# Patient Record
Sex: Male | Born: 1935 | Race: White | Hispanic: No | Smoking: Never smoker
Health system: Southern US, Community
[De-identification: ages and names within clinical notes are randomized; demographics above are authoritative.]

## PROBLEM LIST (undated history)

## (undated) DIAGNOSIS — C801 Malignant (primary) neoplasm, unspecified: Secondary | ICD-10-CM

## (undated) DIAGNOSIS — G309 Alzheimer's disease, unspecified: Secondary | ICD-10-CM

## (undated) DIAGNOSIS — F0280 Dementia in other diseases classified elsewhere without behavioral disturbance: Secondary | ICD-10-CM

## (undated) DIAGNOSIS — N189 Chronic kidney disease, unspecified: Secondary | ICD-10-CM

## (undated) DIAGNOSIS — R296 Repeated falls: Secondary | ICD-10-CM

---

## 2012-03-16 ENCOUNTER — Encounter (HOSPITAL_COMMUNITY): Payer: Self-pay | Admitting: *Deleted

## 2012-03-16 ENCOUNTER — Emergency Department (HOSPITAL_COMMUNITY)
Admission: EM | Admit: 2012-03-16 | Discharge: 2012-03-17 | Disposition: A | Discharge status: Home / Self Care | Payer: Medicare Other | Attending: Emergency Medicine | Admitting: Emergency Medicine

## 2012-03-16 DIAGNOSIS — M7989 Other specified soft tissue disorders: Secondary | ICD-10-CM | POA: Insufficient documentation

## 2012-03-16 DIAGNOSIS — R443 Hallucinations, unspecified: Secondary | ICD-10-CM | POA: Insufficient documentation

## 2012-03-16 DIAGNOSIS — R4182 Altered mental status, unspecified: Secondary | ICD-10-CM

## 2012-03-16 DIAGNOSIS — I10 Essential (primary) hypertension: Secondary | ICD-10-CM | POA: Insufficient documentation

## 2012-03-16 DIAGNOSIS — Z79899 Other long term (current) drug therapy: Secondary | ICD-10-CM | POA: Insufficient documentation

## 2012-03-16 NOTE — ED Notes (Signed)
Family reports that pt has not been to dr in a long time, pt lives alone and has been hearing music playing in his apt that other people don't hear. Pt states that "something electronic is going on there and I've lived there for 22 years." Pt denies medical complaints. Pt A&Ox's 4. Family also c/o swelling and redness to feet. Reports not able to palpate pulses in feet. Pt also hypertensive in triage.

## 2012-03-17 ENCOUNTER — Emergency Department (HOSPITAL_COMMUNITY): Payer: Medicare Other

## 2012-03-17 LAB — RAPID URINE DRUG SCREEN, HOSP PERFORMED
Barbiturates: NOT DETECTED
Cocaine: NOT DETECTED
Tetrahydrocannabinol: NOT DETECTED

## 2012-03-17 LAB — COMPREHENSIVE METABOLIC PANEL
ALT: 14 U/L (ref 0–53)
Alkaline Phosphatase: 78 U/L (ref 39–117)
BUN: 22 mg/dL (ref 6–23)
CO2: 30 mEq/L (ref 19–32)
Chloride: 97 mEq/L (ref 96–112)
GFR calc Af Amer: >90 mL/min (ref >90)
GFR calc non Af Amer: 84 mL/min — ABNORMAL LOW (ref >90)
Glucose, Bld: 88 mg/dL (ref 70–99)
Potassium: 3.8 mEq/L (ref 3.5–5.1)
Sodium: 136 mEq/L (ref 135–145)
Total Bilirubin: 0.7 mg/dL (ref 0.3–1.2)

## 2012-03-17 LAB — URINALYSIS, ROUTINE W REFLEX MICROSCOPIC
Bilirubin Urine: NEGATIVE
Ketones, ur: 15 mg/dL — AB
Nitrite: NEGATIVE
Protein, ur: NEGATIVE mg/dL
Urobilinogen, UA: 0.2 mg/dL (ref 0.0–1.0)
pH: 6 (ref 5.0–8.0)

## 2012-03-17 LAB — CBC
HCT: 43.7 % (ref 39.0–52.0)
Hemoglobin: 14.6 g/dL (ref 13.0–17.0)
RBC: 4.67 MIL/uL (ref 4.22–5.81)

## 2012-03-17 MED ORDER — ZOLPIDEM TARTRATE 5 MG PO TABS: 5.0000 mg | ORAL_TABLET | Freq: Every evening | ORAL | Status: DC | PRN

## 2012-03-17 MED ORDER — LORAZEPAM 0.5 MG PO TABS: 0.5000 mg | ORAL_TABLET | Freq: Three times a day (TID) | ORAL | Status: DC | PRN

## 2012-03-17 MED ORDER — ONDANSETRON HCL 4 MG PO TABS: 4.0000 mg | ORAL_TABLET | Freq: Three times a day (TID) | ORAL | Status: DC | PRN

## 2012-03-17 MED ORDER — LORAZEPAM 1 MG PO TABS: 1.0000 mg | ORAL_TABLET | Freq: Three times a day (TID) | ORAL | Status: DC | PRN

## 2012-03-17 MED ORDER — IBUPROFEN 200 MG PO TABS: 600.0000 mg | ORAL_TABLET | Freq: Three times a day (TID) | ORAL | Status: DC | PRN

## 2012-03-17 MED ORDER — ACETAMINOPHEN 325 MG PO TABS: 650.0000 mg | ORAL_TABLET | ORAL | Status: DC | PRN

## 2012-03-17 NOTE — Discharge Instructions (Signed)
Confusion Confusion is the inability to think with your usual speed or clarity. Confusion may come on quickly or slowly over time. How quickly the confusion comes on depends on the cause. Confusion can be due to any number of causes. CAUSES   Concussion, head injury, or head trauma.   Seizures.   Stroke.   Fever.   Senility.   Heightened emotional states like rage or terror.   Mental illness in which the person loses the ability to determine what is real and what is not (hallucinations).   Infections.   Toxic effects from alcohol, drugs, or prescription medicines.   Dehydration and an imbalance of salts in the body (electrolytes).   Lack of sleep.   Low blood sugar (diabetes).   Low levels of oxygen (for example from chronic lung disorders).   Drug interactions or other medication side effects.   Nutritional deficiencies, especially niacin, thiamine, vitamin C, or vitamin B.   Sudden drop in body temperature (hypothermia).   Illness in the elderly. Constipation can result in confusion. An elderly person who is hospitalized may become confused due to change in daily routine.  SYMPTOMS  People often describe their thinking as cloudy or unclear when they are confused. Confusion can also include feeling disoriented. That means you are unaware of where or who you are. You may also not know what the date or time is. If confused, you may also have difficulty paying attention, remembering and making decisions. Some people also act aggressively when they are confused.  DIAGNOSIS  The medical evaluation of confusion may include:  Blood and urine tests.   X-rays.   Brain and nervous system tests.   Analyzing your brain waves (electroencphalogram or EEG).   A special X-ray (MRI) of your head or other special studies.  Your physician will ask questions such as:  Do you get days and nights mixed up?   Are you awake during regular sleep times?   Do you have trouble  recognizing people?   Do you know where you are?   Do you know the date and time?   Does the confusion come and go?   Is the confusion quickly getting worse?   Has there been a recent illness?   Has there been a recent head injury?   Are you diabetic?   Do you have a lung disorder?   What medication are you taking?   Have you taken drugs or alcohol?  TREATMENT  An admission to the hospital may not be needed, but a confused person should not be left alone. Stay with a family member or friend until the confusion clears. Avoid alcohol, pain relievers or sedative drugs until you have fully recovered. Do not drive until your caregiver says it is okay. HOME CARE INSTRUCTIONS What family and friends can do:  To find out if someone is confused ask him or her their name, age, and the date. If the person is unsure or answers incorrectly, he or she is confused.   Always introduce yourself, no matter how well the person knows you.   Often remind the person of his or her location.   Place a calendar and clock near the confused person.   Talk about current events and plans for the day.   Try to keep the environment calm, quiet and peaceful.   Make sure the patient keeps follow up appointments with their physician.  PREVENTION  Ways to prevent confusion:  Avoid alcohol.   Eat a balanced   diet.   Get enough sleep.   Do not become isolated. Spend time with other people and make plans for your days.   Keep careful watch on your blood sugar levels if you are diabetic.  SEEK IMMEDIATE MEDICAL CARE IF:   You develop severe headaches, repeated vomiting, seizures, blackouts or slurred speech.   There is increasing confusion, weakness, numbness, restlessness or personality changes.   You develop a loss of balance, have marked dizziness, feel uncoordinated or fall.   You have delusions, hallucinations or develop severe anxiety.   Your family members think you need to be rechecked.    Document Released: 10/17/2004 Document Revised: 08/29/2011 Document Reviewed: 06/14/2008 Exit[employer] Patient Information 2012 [employer]. 

## 2012-03-17 NOTE — ED Notes (Signed)
Attempted to call report to Psych ED. They request that pt waits until after shift change to go to Psych ED.

## 2012-03-17 NOTE — ED Provider Notes (Signed)
History     CSN: [id number]  Arrival date & time 03/16/12  1956   First MD Initiated Contact with Patient 03/16/12 2314      Chief Complaint  Patient presents with  . Hypertension  . Leg Swelling     The history is provided by the patient. History Limited By: Level V caveat: AMS.   the patient has not seen a physician in the very long time.  The patient lives alone in an apartment complex and recently the landlord called the family members because the patient has been shearing music in his apartment but nobody else can hear.  Family is concerned because this is abnormal behavior for him.  He has no underlying mental health issues that the family knows about but they state that he may have had some that was just undiagnosed.  His been no recent fever.  His eating his been poor as of lately.  No vomiting or diarrhea is noted by the family or patient.  The family reports is processes are not very clear in that at times he seems to speak of government related issues  History reviewed. No pertinent past medical history.  History reviewed. No pertinent past surgical history.  History reviewed. No pertinent family history.  History  Substance Use Topics  . Smoking status: Not on file  . Smokeless tobacco: Not on file  . Alcohol Use: No      Review of Systems  Unable to perform ROS   Allergies  Review of patient's allergies indicates no known allergies.  Home Medications   Current Outpatient Rx  Name Route Sig Dispense Refill  . ADULT MULTIVITAMIN W/MINERALS CH Oral Take 1 tablet by mouth daily.      BP 162/79  Pulse 60  Temp 98.4 F (36.9 C) (Oral)  Resp 18  Wt 138 lb 9.6 oz (62.869 kg)  SpO2 98%  Physical Exam  Nursing note and vitals reviewed. Constitutional: He is oriented to person, place, and time. He appears well-developed and well-nourished.  HENT:  Head: Normocephalic and atraumatic.  Eyes: EOM are normal.  Neck: Normal range of motion.  Cardiovascular:  Normal rate, regular rhythm, normal heart sounds and intact distal pulses.   Pulmonary/Chest: Effort normal and breath sounds normal. No respiratory distress.  Abdominal: Soft. He exhibits no distension. There is no tenderness.  Musculoskeletal: Normal range of motion.  Neurological: He is alert and oriented to person, place, and time.  Skin: Skin is warm and dry.  Psychiatric: He has a normal mood and affect.    ED Course  Procedures (including critical care time)  Labs Reviewed  COMPREHENSIVE METABOLIC PANEL - Abnormal; Notable for the following:    GFR calc non Af Amer 84 (*)     All other components within normal limits  URINALYSIS, ROUTINE W REFLEX MICROSCOPIC - Abnormal; Notable for the following:    Ketones, ur 15 (*)     All other components within normal limits  CBC  ETHANOL  URINE RAPID DRUG SCREEN (HOSP PERFORMED)   Ct Head Wo Contrast  03/17/2012  *RADIOLOGY REPORT*  Clinical Data: 78 year old male with hypertension, lower extremity swelling, hallucinations.  CT HEAD WITHOUT CONTRAST  Technique:  Contiguous axial images were obtained from the base of the skull through the vertex without contrast.  Comparison: None.  Findings: Visualized paranasal sinuses and mastoids are clear. Right TMJ degenerative changes. No acute osseous abnormality identified.  Visualized orbits and scalp soft tissues are within normal limits.  No  ventriculomegaly.  Cerebral volume within normal limits for age. No midline shift, mass effect, or evidence of mass lesion.  No acute intracranial hemorrhage identified.  No evidence of cortically based acute infarction identified.  No suspicious intracranial vascular hyperdensity.  IMPRESSION: Normal noncontrast CT appearance of the brain for age.  Original Report Authenticated By: Harley Hallmark, M.D.    I personally reviewed the imaging tests through PACS system    No diagnosis found.    MDM  Patient is developing new auditory hallucinations.  His  labs urine and CT scan of his had a normal.  He was hypertensive on arrival but without treatment his hypertension is improved.  He does not followup with primary care Dr.  His family is concerned and I would agree that there may be some mental health and stability and this may be severe depression with psychotic features. ACT to evaluate. Pt may benefit from [facility], MD 03/17/12 [phone]

## 2012-03-17 NOTE — ED Notes (Signed)
Patient returned from X-ray 

## 2012-03-17 NOTE — ED Notes (Signed)
Pt's family left. Pt up and out of bed walking around room. Charge Nurse, Tammy notified that sitter is needed. Lenny, NT will sit with pt.

## 2012-03-17 NOTE — ED Notes (Signed)
Phone numbers of pt's family members. Kevan Ny (daughter) [phone], [phone]. 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 Zeb Comfort (daughter) [phone], work [phone]. Venetia Night (sister) [phone], [phone].

## 2012-03-17 NOTE — ED Provider Notes (Signed)
Pt assessed by act.  Pt hears music but is not having any command auditory hallucinations.  Does not meet criteria for inpatient psychiatric treatment.  [facility]  Family and patient are comfortable with plan.  At this time there does not appear to be any evidence of an acute emergency medical condition and the patient appears stable for discharge with appropriate outpatient follow up.   Celene Kras, MD 03/17/12 1038

## 2012-03-17 NOTE — ED Notes (Signed)
Pt's daughter states that pt lives alone. She states he has had a pneumonia vaccine at age 78.

## 2012-03-17 NOTE — BH Assessment (Signed)
Assessment Note   Evan Maynard is an 78 y.o. male. Patient presents to [facility] ED after family received report from patient's landlord stating patient has been having hallucinations. Hallucinations involve hearing music from other apartments.  Out of concern, patient's family bought patient to the ED to be evaluated.  Patient advised he likes the music and it's pleasant. He denies VH and AH does not provide a command. Patient denied SI/HI.   Patient was cleared medically and does not meet inpatient criteria for psych. CSW provided patient and family with information to [place] and instructions on how to enroll patient for benefits. Patient's sister did not express any concern regarding disposition.  Discussed with EDP who agrees.  Axis I: Psychotic Disorder NOS Axis II: Deferred Axis III: History reviewed. No pertinent past medical history. Axis IV: other psychosocial or environmental problems and problems with access to health care services Axis V: 51-60 moderate symptoms  Past Medical History: History reviewed. No pertinent past medical history.  History reviewed. No pertinent past surgical history.  Family History: History reviewed. No pertinent family history.  Social History:  does not have a smoking history on file. He does not have any smokeless tobacco history on file. He reports that he does not drink alcohol or use illicit drugs.  Additional Social History:     CIWA: CIWA-Ar BP: 162/79 mmHg Pulse Rate: 60  COWS:    Allergies: No Known Allergies  Home Medications:  (Not in a hospital admission)  OB/GYN Status:  No LMP for male patient.  General Assessment Data Location of Assessment: [facility] ED Living Arrangements: Alone Can pt return to current living arrangement?: Yes Admission Status: Voluntary Is patient capable of signing voluntary admission?: Yes Transfer from: Acute Hospital Referral Source: Self/Family/Friend     Risk to self Suicidal Ideation: No Suicidal Intent:  No Is patient at risk for suicide?: No Suicidal Plan?: No Access to Means: No What has been your use of drugs/alcohol within the last 12 months?: NA Previous Attempts/Gestures: No Intentional Self Injurious Behavior: None Family Suicide History: No Persecutory voices/beliefs?: No Depression: No Substance abuse history and/or treatment for substance abuse?: No Suicide prevention information given to non-admitted patients: Not applicable  Risk to Others Homicidal Ideation: No Thoughts of Harm to Others: No Current Homicidal Intent: No Current Homicidal Plan: No Access to Homicidal Means: No History of harm to others?: No Assessment of Violence: None Noted Does patient have access to weapons?: No Criminal Charges Pending?: No Does patient have a court date: No  Psychosis Hallucinations: Auditory (Hears music and whistles) Delusions: None noted  Mental Status Report Appear/Hygiene: Improved Eye Contact: Good Motor Activity: Freedom of movement Speech: Soft;Logical/coherent Level of Consciousness: Alert Affect: Appropriate to circumstance Anxiety Level: None Thought Processes: Flight of Ideas;Relevant;Coherent Judgement: Unimpaired Orientation: Person;Place;Time;Situation Obsessive Compulsive Thoughts/Behaviors: None  Cognitive Functioning Concentration: Decreased Memory: Recent Intact;Remote Intact IQ: Average Insight: Fair Impulse Control: Good Appetite: Fair Weight Loss: 10  Sleep: No Change Vegetative Symptoms: None  ADLScreening [facility] Assessment Services) Patient's cognitive ability adequate to safely complete daily activities?: Yes Patient able to express need for assistance with ADLs?: Yes Independently performs ADLs?: Yes  Abuse/Neglect [facility]) Physical Abuse: Denies Verbal Abuse: Denies Sexual Abuse: Denies  Prior Inpatient Therapy Prior Inpatient Therapy: No  Prior Outpatient Therapy Prior Outpatient Therapy: No  ADL Screening (condition at time  of admission) Patient's cognitive ability adequate to safely complete daily activities?: Yes Patient able to express need for assistance with ADLs?: Yes Independently performs ADLs?:  Yes       Abuse/Neglect Assessment (Assessment to be complete while patient is alone) Physical Abuse: Denies Verbal Abuse: Denies Sexual Abuse: Denies Exploitation of patient/patient's resources: Denies Self-Neglect: Denies Values / Beliefs Cultural Requests During Hospitalization: None Spiritual Requests During Hospitalization: None        Additional Information 1:1 In Past 12 Months?: No CIRT Risk: No Elopement Risk: No Does patient have medical clearance?: Yes     Disposition:  Disposition Disposition of Patient: Outpatient treatment Type of outpatient treatment: Adult (Follow up with [facility])  On Site Evaluation by:   Reviewed with Physician:     Marlaine Hind ANN S 03/17/2012 10:42 AM

## 2014-05-17 ENCOUNTER — Other Ambulatory Visit: Payer: Self-pay | Admitting: Internal Medicine

## 2014-05-17 DIAGNOSIS — R410 Disorientation, unspecified: Secondary | ICD-10-CM

## 2014-05-20 ENCOUNTER — Other Ambulatory Visit: Payer: TRICARE For Life (TFL)

## 2014-05-23 ENCOUNTER — Ambulatory Visit
Admission: RE | Admit: 2014-05-23 | Discharge: 2014-05-23 | Disposition: A | Discharge status: Home / Self Care | Payer: TRICARE For Life (TFL) | Source: Ambulatory Visit | Attending: Internal Medicine | Admitting: Internal Medicine

## 2014-05-23 ENCOUNTER — Other Ambulatory Visit: Payer: TRICARE For Life (TFL)

## 2014-05-23 DIAGNOSIS — R410 Disorientation, unspecified: Secondary | ICD-10-CM

## 2014-08-02 ENCOUNTER — Encounter (HOSPITAL_COMMUNITY): Payer: Self-pay | Admitting: Emergency Medicine

## 2014-08-02 ENCOUNTER — Emergency Department (HOSPITAL_COMMUNITY): Payer: Medicare Other

## 2014-08-02 ENCOUNTER — Emergency Department (HOSPITAL_COMMUNITY)
Admission: EM | Admit: 2014-08-02 | Discharge: 2014-08-02 | Disposition: A | Discharge status: Home / Self Care | Payer: Medicare Other | Attending: Emergency Medicine | Admitting: Emergency Medicine

## 2014-08-02 DIAGNOSIS — S61217A Laceration without foreign body of left little finger without damage to nail, initial encounter: Secondary | ICD-10-CM | POA: Insufficient documentation

## 2014-08-02 DIAGNOSIS — Z9181 History of falling: Secondary | ICD-10-CM | POA: Insufficient documentation

## 2014-08-02 DIAGNOSIS — Z7982 Long term (current) use of aspirin: Secondary | ICD-10-CM | POA: Insufficient documentation

## 2014-08-02 DIAGNOSIS — S61215A Laceration without foreign body of left ring finger without damage to nail, initial encounter: Secondary | ICD-10-CM | POA: Diagnosis not present

## 2014-08-02 DIAGNOSIS — S6992XA Unspecified injury of left wrist, hand and finger(s), initial encounter: Secondary | ICD-10-CM | POA: Diagnosis present

## 2014-08-02 DIAGNOSIS — W108XXA Fall (on) (from) other stairs and steps, initial encounter: Secondary | ICD-10-CM | POA: Diagnosis not present

## 2014-08-02 DIAGNOSIS — Y9301 Activity, walking, marching and hiking: Secondary | ICD-10-CM | POA: Diagnosis not present

## 2014-08-02 DIAGNOSIS — Y9289 Other specified places as the place of occurrence of the external cause: Secondary | ICD-10-CM | POA: Insufficient documentation

## 2014-08-02 DIAGNOSIS — IMO0002 Reserved for concepts with insufficient information to code with codable children: Secondary | ICD-10-CM

## 2014-08-02 DIAGNOSIS — Z23 Encounter for immunization: Secondary | ICD-10-CM | POA: Diagnosis not present

## 2014-08-02 DIAGNOSIS — Y998 Other external cause status: Secondary | ICD-10-CM | POA: Insufficient documentation

## 2014-08-02 DIAGNOSIS — S0081XA Abrasion of other part of head, initial encounter: Secondary | ICD-10-CM | POA: Diagnosis not present

## 2014-08-02 DIAGNOSIS — Z79899 Other long term (current) drug therapy: Secondary | ICD-10-CM | POA: Insufficient documentation

## 2014-08-02 DIAGNOSIS — W19XXXA Unspecified fall, initial encounter: Secondary | ICD-10-CM

## 2014-08-02 MED ORDER — TETANUS-DIPHTH-ACELL PERTUSSIS 5-2.5-18.5 LF-MCG/0.5 IM SUSP
0.5000 mL | Freq: Once | INTRAMUSCULAR | Status: AC
  Administered 2014-08-02: 0.5 mL via INTRAMUSCULAR
  Filled 2014-08-02: qty 0.5

## 2014-08-02 MED ORDER — CEPHALEXIN 500 MG PO CAPS: 500.0000 mg | ORAL_CAPSULE | Freq: Four times a day (QID) | ORAL | Status: DC

## 2014-08-02 MED ORDER — LIDOCAINE HCL 1 % IJ SOLN
5.0000 mL | Freq: Once | INTRAMUSCULAR | Status: AC
  Administered 2014-08-02: 5 mL

## 2014-08-02 NOTE — ED Notes (Signed)
Evan Maynard daughter [phone] call for discharge.

## 2014-08-02 NOTE — ED Notes (Signed)
Pt ambulates in hallway with no assist. No pain.

## 2014-08-02 NOTE — ED Notes (Signed)
Patient transported to X-ray 

## 2014-08-02 NOTE — ED Notes (Signed)
Suture cart at the bedside.  

## 2014-08-02 NOTE — ED Notes (Signed)
Pt from [place] with c/o of fall and left hand pain. No LOC. Abrasions of head

## 2014-08-02 NOTE — ED Provider Notes (Signed)
CSN: [id number]     Arrival date & time 08/02/14  1422 History   First MD Initiated Contact with Patient 08/02/14 1615     Chief Complaint  Patient presents with  . Fall  . Hand Injury     (Consider location/radiation/quality/duration/timing/severity/associated sxs/prior Treatment) HPI Comments: Patient is an 80 year old male who presents the emergency department after a fall. He reports that he was walking up stairs when he lost his balance and fell backwards. He denies any other symptoms. He denies dizziness, lightheadedness, chest pain, shortness of breath. He reports that he has frequent falls due to losing his balance. He denies any loss of consciousness. He denies hitting his head, but there appears to be an abrasion to his forehead. He is unsure of his last tetanus shot.   Patient is a 80 y.o. male presenting with fall and hand injury. The history is provided by the patient. No language interpreter was used.  Fall Pertinent negatives include no abdominal pain, chest pain, chills, fever, headaches, nausea or vomiting.  Hand Injury Associated symptoms: no fever     History reviewed. No pertinent past medical history. History reviewed. No pertinent past surgical history. No family history on file. History  Substance Use Topics  . Smoking status: Not on file  . Smokeless tobacco: Not on file  . Alcohol Use: No    Review of Systems  Constitutional: Negative for fever and chills.  Respiratory: Negative for shortness of breath.   Cardiovascular: Negative for chest pain.  Gastrointestinal: Negative for nausea, vomiting and abdominal pain.  Skin: Positive for wound.  Neurological: Negative for dizziness, light-headedness and headaches.  All other systems reviewed and are negative.     Allergies  Review of patient's allergies indicates no known allergies.  Home Medications   Prior to Admission medications   Medication Sig Start Date End Date Taking? Authorizing Provider    aspirin EC 81 MG tablet Take 81 mg by mouth once a week.   Yes Historical Provider, MD  furosemide (LASIX) 20 MG tablet Take 20 mg by mouth every other day.  07/04/14  Yes Historical Provider, MD  ramipril (ALTACE) 5 MG capsule Take 5 mg by mouth every morning.  07/02/14  Yes Historical Provider, MD  Multiple Vitamin (MULTIVITAMIN WITH MINERALS) TABS Take 1 tablet by mouth daily.    Historical Provider, MD   BP 133/65 mmHg  Pulse 74  Temp(Src) 98.2 F (36.8 C) (Oral)  Resp 14  SpO2 98% Physical Exam  Constitutional: He is oriented to person, place, and time. He appears well-developed and well-nourished. No distress.  HENT:  Head: Normocephalic and atraumatic.    Right Ear: External ear normal.  Left Ear: External ear normal.  Nose: Nose normal.  No broken or loose teeth  Eyes: Conjunctivae and EOM are normal. Pupils are equal, round, and reactive to light.  Neck: Normal range of motion. No spinous process tenderness and no muscular tenderness present. No tracheal deviation present.  Cardiovascular: Normal rate, regular rhythm, normal heart sounds, intact distal pulses and normal pulses.   Pulses:      Radial pulses are 2+ on the right side, and 2+ on the left side.       Posterior tibial pulses are 2+ on the right side, and 2+ on the left side.  Capillary refill < 3 seconds in all fingers  Pulmonary/Chest: Effort normal and breath sounds normal. No stridor.  Abdominal: Soft. He exhibits no distension. There is no tenderness.  Musculoskeletal: Normal  range of motion.  No tenderness to palpation over scaphoid bilaterally Grip strength 5/5 bilaterally  Neurological: He is alert and oriented to person, place, and time. He has normal strength. No sensory deficit. GCS eye subscore is 4. GCS verbal subscore is 5. GCS motor subscore is 6.  Sensation intact to all fingertips Normal gait  Skin: Skin is warm and dry. He is not diaphoretic.  2 cm laceration to distal 5th finger 1.5 cm V  shaped laceration to distal 4th finger No foreign bodies visualized  Psychiatric: He has a normal mood and affect. His behavior is normal.  Nursing note and vitals reviewed.   ED Course  Procedures (including critical care time) Labs Review Labs Reviewed - No data to display  Imaging Review No results found.   EKG Interpretation None      LACERATION REPAIR Performed by: Cleatrice Burke Authorized by: Cleatrice Burke Consent: Verbal consent obtained. Risks and benefits: risks, benefits and alternatives were discussed Consent given by: patient Patient identity confirmed: provided demographic data Prepped and Draped in normal sterile fashion Wound explored  Laceration Location: 5th finger  Laceration Length: 2cm  No Foreign Bodies seen or palpated  Anesthesia: digital block  Local anesthetic: lidocaine 1%  Anesthetic total: 3 ml  Irrigation method: syringe Amount of cleaning: standard  Skin closure: 4-0 Ethilon  Number of sutures: 3  Technique: simple interrupted   Patient tolerance: Patient tolerated the procedure well with no immediate complications. LACERATION REPAIR Performed by: Cleatrice Burke Authorized by: Cleatrice Burke Consent: Verbal consent obtained. Risks and benefits: risks, benefits and alternatives were discussed Consent given by: patient Patient identity confirmed: provided demographic data Prepped and Draped in normal sterile fashion Wound explored  Laceration Location: 4th finger  Laceration Length: 1.5 cm  No Foreign Bodies seen or palpated  Irrigation method: syringe Amount of cleaning: standard  Skin closure: 4-0 Ethilon  Number of sutures: 1  Technique: simple interrupted   Patient tolerance: Patient tolerated the procedure well with no immediate complications.   MDM   Final diagnoses:  Fall  Laceration   Patient with mechanical fall. CT head and cervical spine show no acute abnormality. XR of hand shows no acute  pathology. Tdap booster given. Wound cleaning complete with pressure irrigation, bottom of wound visualized, no foreign bodies appreciated. Laceration occurred < 8 hours prior to repair which was well tolerated.  Discussed suture home care w pt and answered questions. Pt to f-u for wound check and suture removal in 7 days. Will give keflex for prophylaxis. Pt is hemodynamically stable w no complaints prior to dc. Dr. Ralene Bathe evaluated patient and agrees with plan. Patient / Family / Caregiver informed of clinical course, understand medical decision-making process, and agree with plan.       Elwyn Lade, PA-C 08/04/14 0109  Quintella Reichert, MD 08/06/14 [phone]

## 2014-08-02 NOTE — Discharge Instructions (Signed)
Fall Prevention and Home Safety Falls cause injuries and can affect all age groups. It is possible to prevent falls.  HOW TO PREVENT FALLS  Wear shoes with rubber soles that do not have an opening for your toes.  Keep the inside and outside of your house well lit.  Use night lights throughout your home.  Remove clutter from floors.  Clean up floor spills.  Remove throw rugs or fasten them to the floor with carpet tape.  Do not place electrical cords across pathways.  Put grab bars by your tub, shower, and toilet. Do not use towel bars as grab bars.  Put handrails on both sides of the stairway. Fix loose handrails.  Do not climb on stools or stepladders, if possible.  Do not wax your floors.  Repair uneven or unsafe sidewalks, walkways, or stairs.  Keep items you use a lot within reach.  Be aware of pets.  Keep emergency numbers next to the telephone.  Put smoke detectors in your home and near bedrooms. Ask your doctor what other things you can do to prevent falls. Document Released: 07/06/2009 Document Revised: 03/10/2012 Document Reviewed: 12/10/2011 [facility] Patient Information 2015 [place], [place]. This information is not intended to replace advice given to you by your health care provider. Make sure you discuss any questions you have with your health care provider.  Laceration Care, Adult A laceration is a cut that goes through all layers of the skin. The cut goes into the tissue beneath the skin. HOME CARE For stitches (sutures) or staples:  Keep the cut clean and dry.  If you have a bandage (dressing), change it at least once a day. Change the bandage if it gets wet or dirty, or as told by your doctor.  Wash the cut with soap and water 2 times a day. Rinse the cut with water. Pat it dry with a clean towel.  Put a thin layer of medicated cream on the cut as told by your doctor.  You may shower after the first 24 hours. Do not soak the cut in water until the  stitches are removed.  Only take medicines as told by your doctor.  Have your stitches or staples removed as told by your doctor. For skin adhesive strips:  Keep the cut clean and dry.  Do not get the strips wet. You may take a bath, but be careful to keep the cut dry.  If the cut gets wet, pat it dry with a clean towel.  The strips will fall off on their own. Do not remove the strips that are still stuck to the cut. For wound glue:  You may shower or take baths. Do not soak or scrub the cut. Do not swim. Avoid heavy sweating until the glue falls off on its own. After a shower or bath, pat the cut dry with a clean towel.  Do not put medicine on your cut until the glue falls off.  If you have a bandage, do not put tape over the glue.  Avoid lots of sunlight or tanning lamps until the glue falls off. Put sunscreen on the cut for the first year to reduce your scar.  The glue will fall off on its own. Do not pick at the glue. You may need a tetanus shot if:  You cannot remember when you had your last tetanus shot.  You have never had a tetanus shot. If you need a tetanus shot and you choose not to have one, you  may get tetanus. Sickness from tetanus can be serious. GET HELP RIGHT AWAY IF:   Your pain does not get better with medicine.  Your arm, hand, leg, or foot loses feeling (numbness) or changes color.  Your cut is bleeding.  Your joint feels weak, or you cannot use your joint.  You have painful lumps on your body.  Your cut is red, puffy (swollen), or painful.  You have a red line on the skin near the cut.  You have yellowish-white fluid (pus) coming from the cut.  You have a fever.  You have a bad smell coming from the cut or bandage.  Your cut breaks open before or after stitches are removed.  You notice something coming out of the cut, such as wood or glass.  You cannot move a finger or toe. MAKE SURE YOU:   Understand these instructions.  Will watch  your condition.  Will get help right away if you are not doing well or get worse. Document Released: 02/26/2008 Document Revised: 12/02/2011 Document Reviewed: 03/05/2011 [facility] Patient Information 2015 [place], [place]. This information is not intended to replace advice given to you by your health care provider. Make sure you discuss any questions you have with your health care provider.

## 2014-08-02 NOTE — ED Notes (Signed)
Bed: [facility] Expected date:  Expected time:  Means of arrival:  Comments: Ems- fall, left hand fingers lacerations

## 2014-08-02 NOTE — ED Notes (Signed)
Evan Maynard/daughter [phone]

## 2014-12-28 ENCOUNTER — Encounter (HOSPITAL_COMMUNITY): Payer: Self-pay | Admitting: Emergency Medicine

## 2014-12-28 ENCOUNTER — Emergency Department (HOSPITAL_COMMUNITY)
Admission: EM | Admit: 2014-12-28 | Discharge: 2014-12-28 | Disposition: A | Discharge status: Home / Self Care | Payer: Medicare Other | Attending: Emergency Medicine | Admitting: Emergency Medicine

## 2014-12-28 ENCOUNTER — Emergency Department (HOSPITAL_COMMUNITY): Payer: Medicare Other

## 2014-12-28 DIAGNOSIS — Y92193 Bedroom in other specified residential institution as the place of occurrence of the external cause: Secondary | ICD-10-CM | POA: Diagnosis not present

## 2014-12-28 DIAGNOSIS — Y998 Other external cause status: Secondary | ICD-10-CM | POA: Diagnosis not present

## 2014-12-28 DIAGNOSIS — Z8546 Personal history of malignant neoplasm of prostate: Secondary | ICD-10-CM | POA: Diagnosis not present

## 2014-12-28 DIAGNOSIS — Y9389 Activity, other specified: Secondary | ICD-10-CM | POA: Insufficient documentation

## 2014-12-28 DIAGNOSIS — I129 Hypertensive chronic kidney disease with stage 1 through stage 4 chronic kidney disease, or unspecified chronic kidney disease: Secondary | ICD-10-CM | POA: Insufficient documentation

## 2014-12-28 DIAGNOSIS — I2510 Atherosclerotic heart disease of native coronary artery without angina pectoris: Secondary | ICD-10-CM | POA: Insufficient documentation

## 2014-12-28 DIAGNOSIS — S51002A Unspecified open wound of left elbow, initial encounter: Secondary | ICD-10-CM | POA: Insufficient documentation

## 2014-12-28 DIAGNOSIS — N189 Chronic kidney disease, unspecified: Secondary | ICD-10-CM | POA: Diagnosis not present

## 2014-12-28 DIAGNOSIS — I6789 Other cerebrovascular disease: Secondary | ICD-10-CM | POA: Diagnosis not present

## 2014-12-28 DIAGNOSIS — Z85828 Personal history of other malignant neoplasm of skin: Secondary | ICD-10-CM | POA: Diagnosis not present

## 2014-12-28 DIAGNOSIS — W19XXXA Unspecified fall, initial encounter: Secondary | ICD-10-CM

## 2014-12-28 DIAGNOSIS — Z792 Long term (current) use of antibiotics: Secondary | ICD-10-CM | POA: Insufficient documentation

## 2014-12-28 DIAGNOSIS — W06XXXA Fall from bed, initial encounter: Secondary | ICD-10-CM | POA: Diagnosis not present

## 2014-12-28 DIAGNOSIS — Z79899 Other long term (current) drug therapy: Secondary | ICD-10-CM | POA: Insufficient documentation

## 2014-12-28 DIAGNOSIS — S59902A Unspecified injury of left elbow, initial encounter: Secondary | ICD-10-CM | POA: Diagnosis present

## 2014-12-28 DIAGNOSIS — Z7982 Long term (current) use of aspirin: Secondary | ICD-10-CM | POA: Diagnosis not present

## 2014-12-28 HISTORY — DX: Chronic kidney disease, unspecified: N18.9

## 2014-12-28 HISTORY — DX: Malignant (primary) neoplasm, unspecified: C80.1

## 2014-12-28 LAB — URINALYSIS, ROUTINE W REFLEX MICROSCOPIC
BILIRUBIN URINE: NEGATIVE
Glucose, UA: NEGATIVE mg/dL
Hgb urine dipstick: NEGATIVE
Ketones, ur: NEGATIVE mg/dL
Leukocytes, UA: NEGATIVE
Nitrite: NEGATIVE
PROTEIN: NEGATIVE mg/dL
Specific Gravity, Urine: 1.018 (ref 1.005–1.030)
UROBILINOGEN UA: 1 mg/dL (ref 0.0–1.0)
pH: 7 (ref 5.0–8.0)

## 2014-12-28 LAB — I-STAT CHEM 8, ED
BUN: 26 mg/dL — ABNORMAL HIGH (ref 6–23)
Calcium, Ion: 1.16 mmol/L (ref 1.13–1.30)
Chloride: 100 mmol/L (ref 96–112)
Creatinine, Ser: 1.2 mg/dL (ref 0.50–1.35)
Glucose, Bld: 94 mg/dL (ref 70–99)
HCT: 39 % (ref 39.0–52.0)
Hemoglobin: 13.3 g/dL (ref 13.0–17.0)
Potassium: 4.2 mmol/L (ref 3.5–5.1)
Sodium: 139 mmol/L (ref 135–145)
TCO2: 24 mmol/L (ref 0–100)

## 2014-12-28 NOTE — ED Notes (Signed)
Bed: WA15 Expected date:  Expected time:  Means of arrival:  Comments: EMS-fall 

## 2014-12-28 NOTE — ED Provider Notes (Signed)
  Face-to-face evaluation   History: Patient reportedly fell out of bed, a unwitnessed fall. He injured his left arm, as an abrasion.  Physical exam: Alert, calm, cooperative. Good grip strength both hands. Able to move both arms. Moderate rigidity in both legs, but it is symmetric. No deformity of the legs.  Medical screening examination/treatment/procedure(s) were conducted as a shared visit with non-physician practitioner(s) and myself.  I personally evaluated the patient during the encounter  Daleen Bo, MD 12/28/14 [phone]

## 2014-12-28 NOTE — ED Notes (Addendum)
Per EMS pt from [facility] with complaint of unwitnessed fall resulting in left upper arm and left elbow skin tear. Staff report pt is baseline. Pt denies pain.

## 2014-12-28 NOTE — Discharge Instructions (Signed)

## 2014-12-28 NOTE — ED Provider Notes (Signed)
CSN: [id number]     Arrival date & time 12/28/14  1638 History   First MD Initiated Contact with Patient 12/28/14 [phone]     Chief Complaint  Patient presents with  . Fall     (Consider location/radiation/quality/duration/timing/severity/associated sxs/prior Treatment) HPI Comments: Patient with past medical history of CAD, hypertension, skin cancer presents emergency department with chief complaint of fall. He stays at [facility] assisted living.  He reportedly had an unwitnessed fall. Patient states that he was sitting on the edge of his bed and reached for something on his bedside table, losing his balance and falling to the ground. States that he landed on his left elbow. He denies hitting his head. Denies any head pain, neck pain, chest pain, abdominal pain, or pain in his extremities. Denies any recent fevers, chills, shortness of breath, cough, nausea, or vomiting. States that he is otherwise very healthy. He is accompanied by family members, who states that he is acting normally.  The history is provided by the patient. No language interpreter was used.    Past Medical History  Diagnosis Date  . Chronic kidney disease   . Cancer     prostate   History reviewed. No pertinent past surgical history. No family history on file. History  Substance Use Topics  . Smoking status: Not on file  . Smokeless tobacco: Not on file  . Alcohol Use: No    Review of Systems  Constitutional: Negative for fever and chills.  Respiratory: Negative for shortness of breath.   Cardiovascular: Negative for chest pain.  Gastrointestinal: Negative for nausea, vomiting, diarrhea and constipation.  Genitourinary: Negative for dysuria.  Skin: Positive for wound.  All other systems reviewed and are negative.     Allergies  Review of patient's allergies indicates no known allergies.  Home Medications   Prior to Admission medications   Medication Sig Start Date End Date Taking? Authorizing Provider   aspirin 81 MG chewable tablet Chew 81 mg by mouth once a week.   Yes Historical Provider, MD  furosemide (LASIX) 20 MG tablet Take 20 mg by mouth daily.  07/04/14  Yes Historical Provider, MD  cephALEXin (KEFLEX) 500 MG capsule Take 1 capsule (500 mg total) by mouth 4 (four) times daily. 08/02/14   Cleatrice Burke, PA-C   BP 160/70 mmHg  Pulse 75  Temp(Src) 98.6 F (37 C) (Oral)  Resp 20  SpO2 100% Physical Exam  Constitutional: He is oriented to person, place, and time. He appears well-developed and well-nourished.  In c-collar  HENT:  Head: Normocephalic and atraumatic.  Eyes: Conjunctivae and EOM are normal. Pupils are equal, round, and reactive to light. Right eye exhibits no discharge. Left eye exhibits no discharge. No scleral icterus.  Neck: Normal range of motion. Neck supple. No JVD present.  Cardiovascular: Normal rate, regular rhythm and normal heart sounds.  Exam reveals no gallop and no friction rub.   No murmur heard. Pulmonary/Chest: Effort normal and breath sounds normal. No respiratory distress. He has no wheezes. He has no rales. He exhibits no tenderness.  Abdominal: Soft. He exhibits no distension and no mass. There is no tenderness. There is no rebound and no guarding.  Musculoskeletal: Normal range of motion. He exhibits no edema or tenderness.  Moves all extremities, no tenderness to palpation of the extremities  Neurological: He is alert and oriented to person, place, and time.  CN III-12 intact, no pronator drift, speech is clear, movements are goal oriented  Skin: Skin is warm  and dry.  Skin tear to left elbow, no laceration  Psychiatric: He has a normal mood and affect. His behavior is normal. Judgment and thought content normal.  Nursing note and vitals reviewed.   ED Course  Procedures (including critical care time) Results for orders placed or performed during the hospital encounter of 12/28/14  Urinalysis, Routine w reflex microscopic  Result Value  Ref Range   Color, Urine YELLOW YELLOW   APPearance CLEAR CLEAR   Specific Gravity, Urine 1.018 1.005 - 1.030   pH 7.0 5.0 - 8.0   Glucose, UA NEGATIVE NEGATIVE mg/dL   Hgb urine dipstick NEGATIVE NEGATIVE   Bilirubin Urine NEGATIVE NEGATIVE   Ketones, ur NEGATIVE NEGATIVE mg/dL   Protein, ur NEGATIVE NEGATIVE mg/dL   Urobilinogen, UA 1.0 0.0 - 1.0 mg/dL   Nitrite NEGATIVE NEGATIVE   Leukocytes, UA NEGATIVE NEGATIVE  I-stat chem 8, ed  Result Value Ref Range   Sodium 139 135 - 145 mmol/L   Potassium 4.2 3.5 - 5.1 mmol/L   Chloride 100 96 - 112 mmol/L   BUN 26 (H) 6 - 23 mg/dL   Creatinine, Ser 1.20 0.50 - 1.35 mg/dL   Glucose, Bld 94 70 - 99 mg/dL   Calcium, Ion 1.16 1.13 - 1.30 mmol/L   TCO2 24 0 - 100 mmol/L   Hemoglobin 13.3 13.0 - 17.0 g/dL   HCT 39.0 39.0 - 52.0 %   Ct Head Wo Contrast  12/28/2014   CLINICAL DATA:  Unwitnessed fall. Left upper arm and left elbow skin tears. Denies pain.  EXAM: CT HEAD WITHOUT CONTRAST  CT CERVICAL SPINE WITHOUT CONTRAST  TECHNIQUE: Multidetector CT imaging of the head and cervical spine was performed following the standard protocol without intravenous contrast. Multiplanar CT image reconstructions of the cervical spine were also generated.  COMPARISON:  None.  FINDINGS: CT HEAD FINDINGS  There is no evidence of mass effect, midline shift, or extra-axial fluid collections. There is no evidence of a space-occupying lesion or intracranial hemorrhage. There is no evidence of a cortical-based area of acute infarction. There is generalized cerebral atrophy. There is periventricular white matter low attenuation likely secondary to microangiopathy.  The ventricles and sulci are appropriate for the patient's age. The basal cisterns are patent.  Visualized portions of the orbits are unremarkable. The visualized portions of the paranasal sinuses and mastoid air cells are unremarkable. Cerebrovascular atherosclerotic calcifications are noted.  The osseous  structures are unremarkable.  CT CERVICAL SPINE FINDINGS  The alignment is anatomic. The vertebral body heights are maintained. There is no acute fracture. There is no static listhesis. The prevertebral soft tissues are normal. The intraspinal soft tissues are not fully imaged on this examination due to poor soft tissue contrast, but there is no gross soft tissue abnormality.  Degenerative disc disease of the cervical spine most severe at C5-6 and C6-7 with disc height loss. Bilateral facet arthropathy at C2-3 with right foraminal encroachment. Bilateral facet arthropathy at C3-4 with bilateral uncovertebral degenerative changes with foraminal encroachment, left greater than right. Bilateral facet arthropathy at C4-5. Moderate left facet arthropathy and bilateral uncovertebral degenerative changes at C5-6 with bilateral foraminal encroachment. Bilateral uncovertebral degenerative changes at C6-7 with foraminal encroachment.  The visualized portions of the lung apices demonstrate no focal abnormality.There is bilateral carotid artery atherosclerosis.  IMPRESSION: 1.  No acute intracranial pathology. 2. Chronic microvascular disease and cerebral atrophy. 3. No acute osseous injury of the cervical spine. 4. Cervical spine spondylosis.   Electronically Signed  By: Kathreen Devoid   On: 12/28/2014 11:25   Ct Cervical Spine Wo Contrast  12/28/2014   CLINICAL DATA:  Unwitnessed fall. Left upper arm and left elbow skin tears. Denies pain.  EXAM: CT HEAD WITHOUT CONTRAST  CT CERVICAL SPINE WITHOUT CONTRAST  TECHNIQUE: Multidetector CT imaging of the head and cervical spine was performed following the standard protocol without intravenous contrast. Multiplanar CT image reconstructions of the cervical spine were also generated.  COMPARISON:  None.  FINDINGS: CT HEAD FINDINGS  There is no evidence of mass effect, midline shift, or extra-axial fluid collections. There is no evidence of a space-occupying lesion or intracranial  hemorrhage. There is no evidence of a cortical-based area of acute infarction. There is generalized cerebral atrophy. There is periventricular white matter low attenuation likely secondary to microangiopathy.  The ventricles and sulci are appropriate for the patient's age. The basal cisterns are patent.  Visualized portions of the orbits are unremarkable. The visualized portions of the paranasal sinuses and mastoid air cells are unremarkable. Cerebrovascular atherosclerotic calcifications are noted.  The osseous structures are unremarkable.  CT CERVICAL SPINE FINDINGS  The alignment is anatomic. The vertebral body heights are maintained. There is no acute fracture. There is no static listhesis. The prevertebral soft tissues are normal. The intraspinal soft tissues are not fully imaged on this examination due to poor soft tissue contrast, but there is no gross soft tissue abnormality.  Degenerative disc disease of the cervical spine most severe at C5-6 and C6-7 with disc height loss. Bilateral facet arthropathy at C2-3 with right foraminal encroachment. Bilateral facet arthropathy at C3-4 with bilateral uncovertebral degenerative changes with foraminal encroachment, left greater than right. Bilateral facet arthropathy at C4-5. Moderate left facet arthropathy and bilateral uncovertebral degenerative changes at C5-6 with bilateral foraminal encroachment. Bilateral uncovertebral degenerative changes at C6-7 with foraminal encroachment.  The visualized portions of the lung apices demonstrate no focal abnormality.There is bilateral carotid artery atherosclerosis.  IMPRESSION: 1.  No acute intracranial pathology. 2. Chronic microvascular disease and cerebral atrophy. 3. No acute osseous injury of the cervical spine. 4. Cervical spine spondylosis.   Electronically Signed   By: Kathreen Devoid   On: 12/28/2014 11:25     Imaging Review No results found.   EKG Interpretation None      MDM   Final diagnoses:  Fall,  initial encounter    Patient with unwitnessed fall.  In c-collar.  Skin tears to left elbow.  Will check imaging of head and neck.  Patient seen by and discussed with Dr. Eulis Foster, who recommends UA and chem 8.  Will reassess.  Anticipated DC to home.  Imaging is negative. Urinalysis negative for infection. I-STAT Chem-8 within normal limits.   Patient is stable and ready for discharge.   Montine Circle, PA-C 12/28/14 [place], MD 12/28/14 [phone]

## 2015-01-02 ENCOUNTER — Emergency Department (HOSPITAL_COMMUNITY)
Admission: EM | Admit: 2015-01-02 | Discharge: 2015-01-02 | Disposition: A | Discharge status: Home / Self Care | Payer: Medicare Other | Attending: Emergency Medicine | Admitting: Emergency Medicine

## 2015-01-02 ENCOUNTER — Emergency Department (HOSPITAL_COMMUNITY): Payer: Medicare Other

## 2015-01-02 ENCOUNTER — Encounter (HOSPITAL_COMMUNITY): Payer: Self-pay | Admitting: Emergency Medicine

## 2015-01-02 DIAGNOSIS — W1839XA Other fall on same level, initial encounter: Secondary | ICD-10-CM | POA: Insufficient documentation

## 2015-01-02 DIAGNOSIS — S61501A Unspecified open wound of right wrist, initial encounter: Secondary | ICD-10-CM | POA: Insufficient documentation

## 2015-01-02 DIAGNOSIS — Y9289 Other specified places as the place of occurrence of the external cause: Secondary | ICD-10-CM | POA: Diagnosis not present

## 2015-01-02 DIAGNOSIS — N189 Chronic kidney disease, unspecified: Secondary | ICD-10-CM | POA: Diagnosis not present

## 2015-01-02 DIAGNOSIS — Y998 Other external cause status: Secondary | ICD-10-CM | POA: Diagnosis not present

## 2015-01-02 DIAGNOSIS — S51802A Unspecified open wound of left forearm, initial encounter: Secondary | ICD-10-CM | POA: Diagnosis not present

## 2015-01-02 DIAGNOSIS — Y9389 Activity, other specified: Secondary | ICD-10-CM | POA: Insufficient documentation

## 2015-01-02 DIAGNOSIS — Z79899 Other long term (current) drug therapy: Secondary | ICD-10-CM | POA: Insufficient documentation

## 2015-01-02 DIAGNOSIS — Z7982 Long term (current) use of aspirin: Secondary | ICD-10-CM | POA: Insufficient documentation

## 2015-01-02 DIAGNOSIS — S51001A Unspecified open wound of right elbow, initial encounter: Secondary | ICD-10-CM | POA: Diagnosis not present

## 2015-01-02 DIAGNOSIS — S81801A Unspecified open wound, right lower leg, initial encounter: Secondary | ICD-10-CM | POA: Diagnosis not present

## 2015-01-02 DIAGNOSIS — Z8546 Personal history of malignant neoplasm of prostate: Secondary | ICD-10-CM | POA: Diagnosis not present

## 2015-01-02 DIAGNOSIS — T148XXA Other injury of unspecified body region, initial encounter: Secondary | ICD-10-CM

## 2015-01-02 NOTE — ED Notes (Signed)
Bed: WA14 Expected date:  Expected time:  Means of arrival:  Comments: EMS-fall 

## 2015-01-02 NOTE — ED Notes (Signed)
Per EMS, patient from [facility]. Patient fell from bed reaching for his alarm clock. Patient with skin tears to bilateral arms, R lower leg, and L knee. Patient denies pain.

## 2015-01-02 NOTE — ED Provider Notes (Signed)
CSN: [id number]     Arrival date & time 01/02/15  0444 History   First MD Initiated Contact with Patient 01/02/15 [phone]     Chief Complaint  Patient presents with  . Fall     (Consider location/radiation/quality/duration/timing/severity/associated sxs/prior Treatment) HPI Patient presents from nursing home for unwitnessed fall. States he was reaching for some alarm clock and fell to the floor. He denied any head or neck injury. No loss of consciousness. Patient takes baby aspirin weekly but no other anticoagulants. Sustaining multiple skin tears to the bilateral upper extremities and right lower extremity. Was seen in the emergency department earlier this month for similar fall. Patient is at his neurologic baseline. Past Medical History  Diagnosis Date  . Chronic kidney disease   . Cancer     prostate   History reviewed. No pertinent past surgical history. History reviewed. No pertinent family history. History  Substance Use Topics  . Smoking status: Not on file  . Smokeless tobacco: Not on file  . Alcohol Use: No    Review of Systems  Constitutional: Negative for fever and chills.  Respiratory: Negative for shortness of breath.   Cardiovascular: Negative for chest pain.  Gastrointestinal: Negative for nausea, vomiting and abdominal pain.  Musculoskeletal: Negative for back pain and neck pain.  Skin: Positive for wound.  Neurological: Negative for dizziness, syncope, weakness, numbness and headaches.      Allergies  Review of patient's allergies indicates no known allergies.  Home Medications   Prior to Admission medications   Medication Sig Start Date End Date Taking? Authorizing Provider  aspirin 81 MG chewable tablet Chew 81 mg by mouth once a week.   Yes Historical Provider, MD  furosemide (LASIX) 20 MG tablet Take 20 mg by mouth daily.  07/04/14  Yes Historical Provider, MD  cephALEXin (KEFLEX) 500 MG capsule Take 1 capsule (500 mg total) by mouth 4 (four) times  daily. Patient not taking: Reported on 01/02/2015 08/02/14   Cleatrice Burke, PA-C   BP 152/64 mmHg  Pulse 64  Temp(Src) 97.3 F (36.3 C) (Oral)  Resp 18  SpO2 97% Physical Exam  Constitutional: He is oriented to person, place, and time. He appears well-developed and well-nourished. No distress.  Patient is hard of hearing  HENT:  Head: Normocephalic and atraumatic.  Mouth/Throat: Oropharynx is clear and moist. No oropharyngeal exudate.  Eyes: EOM are normal. Pupils are equal, round, and reactive to light.  Neck: Normal range of motion. Neck supple.  No posterior midline cervical tenderness to palpation.  Cardiovascular: Normal rate and regular rhythm.  Exam reveals no gallop and no friction rub.   No murmur heard. Pulmonary/Chest: Effort normal and breath sounds normal. No respiratory distress. He has no wheezes. He has no rales. He exhibits no tenderness.  Abdominal: Soft. Bowel sounds are normal. He exhibits no distension and no mass. There is no tenderness. There is no rebound and no guarding.  Musculoskeletal: Normal range of motion. He exhibits no edema or tenderness.  Full range of motion of all joints. No effusion or deformity. No tenderness to palpation over thoracic or lumbar spine. Pelvis is stable. Distal pulses intact.  Neurological: He is alert and oriented to person, place, and time.  5/5 motor in all extremities. Sensation fully intact.  Skin: Skin is warm and dry. No rash noted. No erythema.  Patient with 6 cm skin tear just distal to the left elbow. Patient has roughly 4 cm skin tear to the right wrist and proximal  right elbow. Patient also has roughly 4 cm skin tear to the lateral surface of the right lower leg in the mid tibial region. No gross contamination.  Psychiatric: He has a normal mood and affect. His behavior is normal.  Nursing note and vitals reviewed.   ED Course  Procedures (including critical care time) Labs Review Labs Reviewed - No data to  display  Imaging Review Ct Head Wo Contrast  01/02/2015   CLINICAL DATA:  Fall from bed  EXAM: CT HEAD WITHOUT CONTRAST  TECHNIQUE: Contiguous axial images were obtained from the base of the skull through the vertex without intravenous contrast.  COMPARISON:  12/28/2014  FINDINGS: Skull and Sinuses:Negative for fracture or destructive process. 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  Orbits: No acute abnormality.  Brain: No evidence of acute infarction, hemorrhage, hydrocephalus, or mass lesion/mass effect. There is generalized brain atrophy with ex vacuo ventricular enlargement. Mild chronic small vessel disease with ischemic gliosis noted around the lateral ventricles.  IMPRESSION: No evidence of intracranial injury.   Electronically Signed   By: Monte Fantasia M.D.   On: 01/02/2015 05:56     EKG Interpretation None      MDM   Final diagnoses:  Multiple skin tears      Wounds irrigated and dressed in the emergency department. CT head without evidence of intracranial injury. Will discharge back to nursing home.  Julianne Rice, MD 01/02/15 [phone]

## 2015-01-02 NOTE — Discharge Instructions (Signed)
Skin Tear Care  A skin tear is a wound in which the top layer of skin has peeled off. This is a common problem with aging because the skin becomes thinner and more fragile as a person gets older. In addition, some medicines, such as oral corticosteroids, can lead to skin thinning if taken for long periods of time.   A skin tear is often repaired with tape or skin adhesive strips. This keeps the skin that has been peeled off in contact with the healthier skin beneath. Depending on the location of the wound, a bandage (dressing) may be applied over the tape or skin adhesive strips. Sometimes, during the healing process, the skin turns Couey and dies. Even when this happens, the torn skin acts as a good dressing until the skin underneath gets healthier and repairs itself.  HOME CARE INSTRUCTIONS   · Change dressings once per day or as directed by your caregiver.  ¨ Gently clean the skin tear and the area around the tear using saline solution or mild soap and water.  ¨ Do not rub the injured skin dry. Let the area air dry.  ¨ Apply petroleum jelly or an antibiotic cream or ointment to keep the tear moist. This will help the wound heal. Do not allow a scab to form.  ¨ If the dressing sticks before the next dressing change, moisten it with warm soapy water and gently remove it.  · Protect the injured skin until it has healed.  · Only take over-the-counter or prescription medicines as directed by your caregiver.  · Take showers or baths using warm soapy water. Apply a new dressing after the shower or bath.  · Keep all follow-up appointments as directed by your caregiver.    SEEK IMMEDIATE MEDICAL CARE IF:   · You have redness, swelling, or increasing pain in the skin tear.  · You have pus coming from the skin tear.  · You have chills.  · You have a red streak that goes away from the skin tear.  · You have a bad smell coming from the tear or dressing.  · You have a fever or persistent symptoms for more than 2-3 days.  · You  have a fever and your symptoms suddenly get worse.  MAKE SURE YOU:  · Understand these instructions.  · Will watch this condition.  · Will get help right away if your child is not doing well or gets worse.  Document Released: 06/04/2001 Document Revised: 06/03/2012 Document Reviewed: 03/23/2012  [employer]® Patient Information ©2015 [employer]. This information is not intended to replace advice given to you by your health care provider. Make sure you discuss any questions you have with your health care provider.

## 2015-01-02 NOTE — ED Notes (Signed)
Wound care documented below preformed prior to patient transport to CT. Patient has returned from CT at this time.

## 2015-01-02 NOTE — ED Notes (Signed)
Patient transported to CT 

## 2015-01-06 ENCOUNTER — Emergency Department (HOSPITAL_COMMUNITY)
Admission: EM | Admit: 2015-01-06 | Discharge: 2015-01-06 | Disposition: A | Discharge status: Home / Self Care | Payer: Medicare Other | Attending: Emergency Medicine | Admitting: Emergency Medicine

## 2015-01-06 ENCOUNTER — Emergency Department (HOSPITAL_COMMUNITY): Payer: Medicare Other

## 2015-01-06 ENCOUNTER — Encounter (HOSPITAL_COMMUNITY): Payer: Self-pay | Admitting: Emergency Medicine

## 2015-01-06 DIAGNOSIS — S51002A Unspecified open wound of left elbow, initial encounter: Secondary | ICD-10-CM | POA: Diagnosis not present

## 2015-01-06 DIAGNOSIS — S4992XA Unspecified injury of left shoulder and upper arm, initial encounter: Secondary | ICD-10-CM

## 2015-01-06 DIAGNOSIS — Y92094 Garage of other non-institutional residence as the place of occurrence of the external cause: Secondary | ICD-10-CM | POA: Insufficient documentation

## 2015-01-06 DIAGNOSIS — Y9389 Activity, other specified: Secondary | ICD-10-CM | POA: Diagnosis not present

## 2015-01-06 DIAGNOSIS — Z792 Long term (current) use of antibiotics: Secondary | ICD-10-CM | POA: Insufficient documentation

## 2015-01-06 DIAGNOSIS — S0990XA Unspecified injury of head, initial encounter: Secondary | ICD-10-CM | POA: Diagnosis present

## 2015-01-06 DIAGNOSIS — F0390 Unspecified dementia without behavioral disturbance: Secondary | ICD-10-CM | POA: Insufficient documentation

## 2015-01-06 DIAGNOSIS — W108XXA Fall (on) (from) other stairs and steps, initial encounter: Secondary | ICD-10-CM | POA: Insufficient documentation

## 2015-01-06 DIAGNOSIS — Y998 Other external cause status: Secondary | ICD-10-CM | POA: Insufficient documentation

## 2015-01-06 DIAGNOSIS — S0003XA Contusion of scalp, initial encounter: Secondary | ICD-10-CM | POA: Insufficient documentation

## 2015-01-06 DIAGNOSIS — Z79899 Other long term (current) drug therapy: Secondary | ICD-10-CM | POA: Diagnosis not present

## 2015-01-06 DIAGNOSIS — Z8546 Personal history of malignant neoplasm of prostate: Secondary | ICD-10-CM | POA: Diagnosis not present

## 2015-01-06 DIAGNOSIS — N189 Chronic kidney disease, unspecified: Secondary | ICD-10-CM | POA: Insufficient documentation

## 2015-01-06 DIAGNOSIS — R911 Solitary pulmonary nodule: Secondary | ICD-10-CM

## 2015-01-06 DIAGNOSIS — W19XXXA Unspecified fall, initial encounter: Secondary | ICD-10-CM

## 2015-01-06 DIAGNOSIS — R918 Other nonspecific abnormal finding of lung field: Secondary | ICD-10-CM | POA: Diagnosis not present

## 2015-01-06 LAB — CBC WITH DIFFERENTIAL/PLATELET
BASOS PCT: 0 % (ref 0–1)
Basophils Absolute: 0 10*3/uL (ref 0.0–0.1)
Eosinophils Absolute: 0.1 10*3/uL (ref 0.0–0.7)
Eosinophils Relative: 1 % (ref 0–5)
HCT: 40.4 % (ref 39.0–52.0)
Hemoglobin: 13.1 g/dL (ref 13.0–17.0)
Lymphocytes Relative: 16 % (ref 12–46)
Lymphs Abs: 1.6 10*3/uL (ref 0.7–4.0)
MCH: 29.9 pg (ref 26.0–34.0)
MCHC: 32.4 g/dL (ref 30.0–36.0)
MCV: 92.2 fL (ref 78.0–100.0)
Monocytes Absolute: 0.6 10*3/uL (ref 0.1–1.0)
Monocytes Relative: 6 % (ref 3–12)
NEUTROS ABS: 7.3 10*3/uL (ref 1.7–7.7)
Neutrophils Relative %: 77 % (ref 43–77)
Platelets: 227 10*3/uL (ref 150–400)
RBC: 4.38 MIL/uL (ref 4.22–5.81)
RDW: 13.3 % (ref 11.5–15.5)
WBC: 9.6 10*3/uL (ref 4.0–10.5)

## 2015-01-06 LAB — BASIC METABOLIC PANEL
Anion gap: 9 (ref 5–15)
BUN: 22 mg/dL (ref 6–23)
CALCIUM: 8.9 mg/dL (ref 8.4–10.5)
CO2: 29 mmol/L (ref 19–32)
Chloride: 102 mmol/L (ref 96–112)
Creatinine, Ser: 1.04 mg/dL (ref 0.50–1.35)
GFR calc Af Amer: 73 mL/min — ABNORMAL LOW (ref >90)
GFR calc non Af Amer: 63 mL/min — ABNORMAL LOW (ref >90)
GLUCOSE: 113 mg/dL — AB (ref 70–99)
Potassium: 4.3 mmol/L (ref 3.5–5.1)
SODIUM: 140 mmol/L (ref 135–145)

## 2015-01-06 LAB — URINALYSIS, ROUTINE W REFLEX MICROSCOPIC
Bilirubin Urine: NEGATIVE
Glucose, UA: NEGATIVE mg/dL
Ketones, ur: NEGATIVE mg/dL
Leukocytes, UA: NEGATIVE
Nitrite: NEGATIVE
Protein, ur: NEGATIVE mg/dL
SPECIFIC GRAVITY, URINE: 1.008 (ref 1.005–1.030)
Urobilinogen, UA: 1 mg/dL (ref 0.0–1.0)
pH: 6.5 (ref 5.0–8.0)

## 2015-01-06 LAB — I-STAT TROPONIN, ED: Troponin i, poc: 0 ng/mL (ref 0.00–0.08)

## 2015-01-06 LAB — URINE MICROSCOPIC-ADD ON

## 2015-01-06 NOTE — ED Notes (Signed)
Pt trying to step up onto a porch, lost balance, fell backwards. Laceration to back of head and left elbow. No complaints of neck/back pain. Not brought in on LSB or in a C-Collar. Came from daughter's home but lives at [facility] on [address].

## 2015-01-06 NOTE — ED Provider Notes (Signed)
CSN: [id number]     Arrival date & time 01/06/15  1407 History   None    Chief Complaint  Patient presents with  . Fall  . Head Laceration  . Extremity Laceration     (Consider location/radiation/quality/duration/timing/severity/associated sxs/prior Treatment) Patient is a 81 y.o. male presenting with fall and scalp laceration. The history is provided by the patient and a relative. No language interpreter was used.  Fall Pertinent negatives include no neck pain, numbness or weakness.  Head Laceration Pertinent negatives include no neck pain, numbness or weakness.   Mr. Rumery is an 81 y.o white male with a history of dementia who presents after unwitnessed fall in his niece's garage just prior to arrival.  His sister brought him home for the nursing home for the day and he was walking around at the time.  She says she came around the corner and saw him lying on the floor bleeding from the left arm.  She tried to get him up but he was complaining of back pain and a head injury.  He cannot tell me if he tripped or fell from the step vs being dizzy or having a syncope. He denies any headache, shortness of breath, chest pain, abdominal pain, nausea, vomiting, or back pain. He was recently taken off of aspirin on Monday after a previous fall. He has several hematomas and skin tears that are covered by Tegaderm and are not new.  Past Medical History  Diagnosis Date  . Chronic kidney disease   . Cancer     prostate   History reviewed. No pertinent past surgical history. History reviewed. No pertinent family history. History  Substance Use Topics  . Smoking status: Not on file  . Smokeless tobacco: Not on file  . Alcohol Use: No    Review of Systems  Gastrointestinal: Negative for abdominal distention.  Genitourinary: Positive for frequency.  Musculoskeletal: Negative for neck pain.  Skin: Positive for wound.  Neurological: Negative for dizziness, weakness and numbness.  All other  systems reviewed and are negative.     Allergies  Review of patient's allergies indicates no known allergies.  Home Medications   Prior to Admission medications   Medication Sig Start Date End Date Taking? Authorizing Provider  furosemide (LASIX) 20 MG tablet Take 20 mg by mouth daily.  07/04/14  Yes Historical Provider, MD  cephALEXin (KEFLEX) 500 MG capsule Take 1 capsule (500 mg total) by mouth 4 (four) times daily. Patient not taking: Reported on 01/02/2015 08/02/14   Cleatrice Burke, PA-C   BP 148/75 mmHg  Pulse 82  Temp(Src) 98.3 F (36.8 C) (Oral)  Resp 17  SpO2 99% Physical Exam  Constitutional: He is oriented to person, place, and time. He appears well-developed and well-nourished.  HENT:  Head: Normocephalic and atraumatic.  He has a small contusion with soft tissue swelling on the back of the head.  No laceration.   Eyes: Conjunctivae are normal.  Neck: Normal range of motion. Neck supple.  No midline C-spine, T-spine, or L-spine tenderness.   Cardiovascular: Normal rate, regular rhythm and normal heart sounds.   Pulmonary/Chest: Effort normal and breath sounds normal. No respiratory distress.  No chest wall or rib tenderness.   Abdominal: Soft. There is no tenderness.  No abdominal ecchymosis.   Musculoskeletal: Normal range of motion.  He is able to move both upper and lower extremities without difficulty.  He has a right lower leg wound with a dressing change yesterday and is covered by  tegaderm.  He has an old hematoma over the left patella.    He has a new left skin tear that is not actively bleeding over the left elbow.  Neurological: He is alert and oriented to person, place, and time.  Skin: Skin is warm and dry.  Nursing note and vitals reviewed.   ED Course  Procedures (including critical care time) Labs Review Labs Reviewed  BASIC METABOLIC PANEL - Abnormal; Notable for the following:    Glucose, Bld 113 (*)    GFR calc non Af Amer 63 (*)    GFR  calc Af Amer 73 (*)    All other components within normal limits  URINALYSIS, ROUTINE W REFLEX MICROSCOPIC - Abnormal; Notable for the following:    Hgb urine dipstick SMALL (*)    All other components within normal limits  CBC WITH DIFFERENTIAL/PLATELET  URINE MICROSCOPIC-ADD ON  Randolm Idol, ED    Imaging Review Dg Chest 1 View  01/06/2015   CLINICAL DATA:  [place] down the stairs  EXAM: CHEST  1 VIEW  COMPARISON:  None  FINDINGS: Normal heart size, mediastinal contours and pulmonary vascularity.  Atherosclerotic calcification mild tortuosity of thoracic aorta.  Nodular density in RIGHT mid lung 14 mm greatest diameter, uncertain etiology, uncertain if calcified.  No acute infiltrate, pleural effusion or pneumothorax.  Bones demineralized.  IMPRESSION: RIGHT mid lung nodular density 14 mm diameter, uncertain etiology; CT chest recommended to exclude pulmonary nodule.   Electronically Signed   By: Lavonia Dana M.D.   On: 01/06/2015 15:48   Dg Elbow Complete Left  01/06/2015   CLINICAL DATA:  Fall, extremity laceration  EXAM: LEFT ELBOW - COMPLETE 3+ VIEW  COMPARISON:  None.  FINDINGS: No fracture or dislocation is seen.  The joint spaces are preserved.  No displaced elbow joint fat pads to suggest an elbow joint effusion.  Soft tissue swelling along the dorsal aspect of the distal forearm/elbow.  No radiopaque foreign body is seen.  IMPRESSION: No fracture, dislocation, or radiopaque foreign body is seen.   Electronically Signed   By: Julian Hy M.D.   On: 01/06/2015 16:14   Ct Head Wo Contrast  01/06/2015   CLINICAL DATA:  Loss of balance, falling backwards while trying to step up onto porch. Laceration to the back of the head.  EXAM: CT HEAD WITHOUT CONTRAST  CT CERVICAL SPINE WITHOUT CONTRAST  TECHNIQUE: Multidetector CT imaging of the head and cervical spine was performed following the standard protocol without intravenous contrast. Multiplanar CT image reconstructions of the cervical  spine were also generated.  COMPARISON:  01/02/2015  FINDINGS: CT HEAD FINDINGS  The brain shows generalized atrophy. There chronic small-vessel ischemic changes affecting the cerebral hemispheric deep white matter. Ventricles are prominent consistent with ex vacuo enlargement. No cortical or large vessel territory infarction. Few old small vessel cerebellar infarctions. No mass lesion, hemorrhage, hydrocephalus or extra-axial collection. No skull fracture. No fluid in the sinuses, middle ears or mastoids.  CT CERVICAL SPINE FINDINGS  No traumatic malalignment. No visible fracture. Degenerative osteoarthritis of the C1-2 articulation. Degenerative spondylosis with endplate osteophytes but no advanced bony compromise of the spinal canal. Bilateral facet arthropathy.  IMPRESSION: Head CT: No acute or traumatic finding. Chronic atrophy and small-vessel disease.  Cervical spine CT: No acute or traumatic finding. Chronic degenerative changes.   Electronically Signed   By: Nelson Chimes M.D.   On: 01/06/2015 16:23   Ct Cervical Spine Wo Contrast  01/06/2015   CLINICAL  DATA:  Loss of balance, falling backwards while trying to step up onto porch. Laceration to the back of the head.  EXAM: CT HEAD WITHOUT CONTRAST  CT CERVICAL SPINE WITHOUT CONTRAST  TECHNIQUE: Multidetector CT imaging of the head and cervical spine was performed following the standard protocol without intravenous contrast. Multiplanar CT image reconstructions of the cervical spine were also generated.  COMPARISON:  01/02/2015  FINDINGS: CT HEAD FINDINGS  The brain shows generalized atrophy. There chronic small-vessel ischemic changes affecting the cerebral hemispheric deep white matter. Ventricles are prominent consistent with ex vacuo enlargement. No cortical or large vessel territory infarction. Few old small vessel cerebellar infarctions. No mass lesion, hemorrhage, hydrocephalus or extra-axial collection. No skull fracture. No fluid in the sinuses,  middle ears or mastoids.  CT CERVICAL SPINE FINDINGS  No traumatic malalignment. No visible fracture. Degenerative osteoarthritis of the C1-2 articulation. Degenerative spondylosis with endplate osteophytes but no advanced bony compromise of the spinal canal. Bilateral facet arthropathy.  IMPRESSION: Head CT: No acute or traumatic finding. Chronic atrophy and small-vessel disease.  Cervical spine CT: No acute or traumatic finding. Chronic degenerative changes.   Electronically Signed   By: Nelson Chimes M.D.   On: 01/06/2015 16:23   Dg Shoulder Left  01/06/2015   CLINICAL DATA:  Pt fell down the stairs and complains of pain and abrasions of the left shoulder.  EXAM: LEFT SHOULDER - 2+ VIEW  COMPARISON:  None.  FINDINGS: No fracture. Glenohumeral joint normally spaced and aligned. There are minor AC joint osteoarthritic changes reflected by small endplate osteophytes.  Bones are demineralized.  Soft tissues are unremarkable.  IMPRESSION: No fracture or dislocation.   Electronically Signed   By: Lajean Manes M.D.   On: 01/06/2015 16:12   EKG interpretation Vent. rate 81 BPM PR interval 58 ms QRS duration 87 ms QT/QTc 402/467 ms P-R-T axes 80 79 43 Sinus rhythm Short PR interval Minimal ST depression, inferior leads MDM   Final diagnoses:  Fall, initial encounter  Arm injury, left, initial encounter  Incidental pulmonary nodule, greater than or equal to 67mm   Patient presents after unwitnessed fall.  Poor historian due to dementia. I did a full workup since there was an unknown cause and he had previous falls this week.   CBC and BMP are unremarkable.  No UTI or pneumonia.  Negative istat troponin and normal EKG with sinus rhythm.  The second EKG states afib but patient was moving during EKG and is not in afib at bedside. CT head shows no acute or traumatic finding. CT spine shows no fracture. Left elbow and shoulder show no fracture.  Nurse has applied dressing to wound on left arm.  The wound  was a skin tear and did not require sutures.  Patient is stable and speaking appropriately. He is at baseline per family.  Vitals are normal.  I spoke to family regarding negative workup and incidental pulmonary nodule which requires follow up with pcp.  They agree with the plan.      Ottie Glazier, PA-C 01/06/15 [address] PA-C 01/06/15 1738  Evelina Bucy, MD 01/07/15 [phone]

## 2015-01-06 NOTE — ED Notes (Signed)
Bed: LE17 Expected date:  Expected time:  Means of arrival:  Comments: EMS- 81yo M, fall, no LOC/no thinners

## 2015-01-06 NOTE — ED Provider Notes (Signed)
MSE was initiated and I personally evaluated the patient and placed orders (if any) at  2:42 PM on January 06, 2015.  The patient appears stable so that the remainder of the MSE may be completed by another provider.  Patient hx of dementia from nursing home with with fall. Has frequent fall and was here 4 days ago. Has L elbow laceration today. ? Syncope. Patient confused, don't know baseline. Has multiple old bruises but new L shoulder bruise and L elbow laceration. Will get CT head/nec, xrays. Will order labs and EKG since he may have a syncope.   Wandra Arthurs, MD 01/06/15 [phone]

## 2015-01-06 NOTE — ED Notes (Addendum)
Pt is A&o self/situation. Uncertain if this is baseline. Pt has several skin tears to bilateral arms with dressings cover sites. Most prominent skin tear/laceration is to LPFA. Dressing was saturated upon arrival and changed.

## 2015-01-06 NOTE — Discharge Instructions (Signed)
Fall Prevention and Home Safety Follow up with your primary care provider for incidental pulmonary nodule. Take tylenol or motrin as needed for pain.   Falls cause injuries and can affect all age groups. It is possible to use preventive measures to significantly decrease the likelihood of falls. There are many simple measures which can make your home safer and prevent falls. OUTDOORS  Repair cracks and edges of walkways and driveways.  Remove high doorway thresholds.  Trim shrubbery on the main path into your home.  Have good outside lighting.  Clear walkways of tools, rocks, debris, and clutter.  Check that handrails are not broken and are securely fastened. Both sides of steps should have handrails.  Have leaves, snow, and ice cleared regularly.  Use sand or salt on walkways during winter months.  In the garage, clean up grease or oil spills. BATHROOM  Install night lights.  Install grab bars by the toilet and in the tub and shower.  Use non-skid mats or decals in the tub or shower.  Place a plastic non-slip stool in the shower to sit on, if needed.  Keep floors dry and clean up all water on the floor immediately.  Remove soap buildup in the tub or shower on a regular basis.  Secure bath mats with non-slip, double-sided rug tape.  Remove throw rugs and tripping hazards from the floors. BEDROOMS  Install night lights.  Make sure a bedside light is easy to reach.  Do not use oversized bedding.  Keep a telephone by your bedside.  Have a firm chair with side arms to use for getting dressed.  Remove throw rugs and tripping hazards from the floor. KITCHEN  Keep handles on pots and pans turned toward the center of the stove. Use back burners when possible.  Clean up spills quickly and allow time for drying.  Avoid walking on wet floors.  Avoid hot utensils and knives.  Position shelves so they are not too high or low.  Place commonly used objects within easy  reach.  If necessary, use a sturdy step stool with a grab bar when reaching.  Keep electrical cables out of the way.  Do not use floor polish or wax that makes floors slippery. If you must use wax, use non-skid floor wax.  Remove throw rugs and tripping hazards from the floor. STAIRWAYS  Never leave objects on stairs.  Place handrails on both sides of stairways and use them. Fix any loose handrails. Make sure handrails on both sides of the stairways are as long as the stairs.  Check carpeting to make sure it is firmly attached along stairs. Make repairs to worn or loose carpet promptly.  Avoid placing throw rugs at the top or bottom of stairways, or properly secure the rug with carpet tape to prevent slippage. Get rid of throw rugs, if possible.  Have an electrician put in a light switch at the top and bottom of the stairs. OTHER FALL PREVENTION TIPS  Wear low-heel or rubber-soled shoes that are supportive and fit well. Wear closed toe shoes.  When using a stepladder, make sure it is fully opened and both spreaders are firmly locked. Do not climb a closed stepladder.  Add color or contrast paint or tape to grab bars and handrails in your home. Place contrasting color strips on first and last steps.  Learn and use mobility aids as needed. Install an electrical emergency response system.  Turn on lights to avoid dark areas. Replace light bulbs that  burn out immediately. Get light switches that glow.  Arrange furniture to create clear pathways. Keep furniture in the same place.  Firmly attach carpet with non-skid or double-sided tape.  Eliminate uneven floor surfaces.  Select a carpet pattern that does not visually hide the edge of steps.  Be aware of all pets. OTHER HOME SAFETY TIPS  Set the water temperature for 120 F (48.8 C).  Keep emergency numbers on or near the telephone.  Keep smoke detectors on every level of the home and near sleeping areas. Document Released:  08/30/2002 Document Revised: 03/10/2012 Document Reviewed: 11/29/2011 [facility] Patient Information 2015 [place], [place]. This information is not intended to replace advice given to you by your health care provider. Make sure you discuss any questions you have with your health care provider.

## 2015-01-09 ENCOUNTER — Emergency Department (HOSPITAL_COMMUNITY)
Admission: EM | Admit: 2015-01-09 | Discharge: 2015-01-10 | Disposition: A | Discharge status: Home / Self Care | Payer: Medicare Other | Attending: Emergency Medicine | Admitting: Emergency Medicine

## 2015-01-09 ENCOUNTER — Encounter (HOSPITAL_COMMUNITY): Payer: Self-pay | Admitting: Emergency Medicine

## 2015-01-09 ENCOUNTER — Emergency Department (HOSPITAL_COMMUNITY): Payer: Medicare Other

## 2015-01-09 DIAGNOSIS — Y998 Other external cause status: Secondary | ICD-10-CM | POA: Insufficient documentation

## 2015-01-09 DIAGNOSIS — Z79899 Other long term (current) drug therapy: Secondary | ICD-10-CM | POA: Insufficient documentation

## 2015-01-09 DIAGNOSIS — S59912A Unspecified injury of left forearm, initial encounter: Secondary | ICD-10-CM | POA: Insufficient documentation

## 2015-01-09 DIAGNOSIS — Z8546 Personal history of malignant neoplasm of prostate: Secondary | ICD-10-CM | POA: Insufficient documentation

## 2015-01-09 DIAGNOSIS — S3992XA Unspecified injury of lower back, initial encounter: Secondary | ICD-10-CM | POA: Insufficient documentation

## 2015-01-09 DIAGNOSIS — S51002A Unspecified open wound of left elbow, initial encounter: Secondary | ICD-10-CM | POA: Diagnosis not present

## 2015-01-09 DIAGNOSIS — S80212A Abrasion, left knee, initial encounter: Secondary | ICD-10-CM

## 2015-01-09 DIAGNOSIS — W1839XA Other fall on same level, initial encounter: Secondary | ICD-10-CM | POA: Diagnosis not present

## 2015-01-09 DIAGNOSIS — Z792 Long term (current) use of antibiotics: Secondary | ICD-10-CM | POA: Diagnosis not present

## 2015-01-09 DIAGNOSIS — F0390 Unspecified dementia without behavioral disturbance: Secondary | ICD-10-CM | POA: Insufficient documentation

## 2015-01-09 DIAGNOSIS — Y9389 Activity, other specified: Secondary | ICD-10-CM | POA: Insufficient documentation

## 2015-01-09 DIAGNOSIS — S29092A Other injury of muscle and tendon of back wall of thorax, initial encounter: Secondary | ICD-10-CM | POA: Diagnosis not present

## 2015-01-09 DIAGNOSIS — N189 Chronic kidney disease, unspecified: Secondary | ICD-10-CM | POA: Insufficient documentation

## 2015-01-09 DIAGNOSIS — S53105A Unspecified dislocation of left ulnohumeral joint, initial encounter: Secondary | ICD-10-CM

## 2015-01-09 DIAGNOSIS — Y92128 Other place in nursing home as the place of occurrence of the external cause: Secondary | ICD-10-CM | POA: Diagnosis not present

## 2015-01-09 DIAGNOSIS — W19XXXA Unspecified fall, initial encounter: Secondary | ICD-10-CM

## 2015-01-09 DIAGNOSIS — S59902A Unspecified injury of left elbow, initial encounter: Secondary | ICD-10-CM | POA: Diagnosis present

## 2015-01-09 MED ORDER — ACETAMINOPHEN 325 MG PO TABS
650.0000 mg | ORAL_TABLET | Freq: Once | ORAL | Status: AC
  Administered 2015-01-10: 650 mg via ORAL
  Filled 2015-01-09: qty 2

## 2015-01-09 NOTE — ED Notes (Signed)
Patient transported to X-ray 

## 2015-01-09 NOTE — ED Provider Notes (Signed)
CSN: [id number]     Arrival date & time 01/09/15  2113 History   First MD Initiated Contact with Patient 01/09/15 2218     Chief Complaint  Patient presents with  . Fall     (Consider location/radiation/quality/duration/timing/severity/associated sxs/prior Treatment) HPI  81 year old male with a history of dementia presents from the nursing home for a recurrent fall. This is fourth visit to the ER in the last week and a half for falls. I talked to the tech that was taking care of him at [facility] she notes that he was walking normally and then seemed to just follow over. He was never unconscious and did not seem to pass out. This seemed to be like his prior falls. He landed on his left elbow and left knee. Never hit his head or neck. Patient was fully immobilized by EMS. Patient unable to provide further history, does complain of back pain that he attributes to the spinal board. Also complaining of neck pain after the c-collar was placed.  Past Medical History  Diagnosis Date  . Chronic kidney disease   . Cancer     prostate   History reviewed. No pertinent past surgical history. History reviewed. No pertinent family history. History  Substance Use Topics  . Smoking status: Not on file  . Smokeless tobacco: Not on file  . Alcohol Use: No    Review of Systems  Unable to perform ROS: Dementia      Allergies  Review of patient's allergies indicates no known allergies.  Home Medications   Prior to Admission medications   Medication Sig Start Date End Date Taking? Authorizing Provider  furosemide (LASIX) 20 MG tablet Take 20 mg by mouth daily.  07/04/14  Yes Historical Provider, MD  cephALEXin (KEFLEX) 500 MG capsule Take 1 capsule (500 mg total) by mouth 4 (four) times daily. Patient not taking: Reported on 01/02/2015 08/02/14   Cleatrice Burke, PA-C   BP 139/59 mmHg  Pulse 74  Temp(Src) 97.8 F (36.6 C) (Oral)  Resp 18  SpO2 100% Physical Exam  Constitutional: He is  oriented to person, place, and time. He appears well-developed and well-nourished. Cervical collar and backboard in place.  HENT:  Head: Normocephalic and atraumatic.  Right Ear: External ear normal.  Left Ear: External ear normal.  Nose: Nose normal.  Eyes: Right eye exhibits no discharge. Left eye exhibits no discharge.  Neck: Normal range of motion. Neck supple. No spinous process tenderness and no muscular tenderness present. No rigidity.  Cardiovascular: Normal rate, regular rhythm, normal heart sounds and intact distal pulses.   Pulmonary/Chest: Effort normal.  Abdominal: Soft. There is no tenderness.  Musculoskeletal: He exhibits no edema.       Left elbow: He exhibits normal range of motion. Tenderness found.       Left knee: He exhibits normal range of motion and no swelling. No tenderness found.       Thoracic back: He exhibits tenderness (mild).       Lumbar back: He exhibits tenderness (mild).       Left forearm: He exhibits tenderness, swelling and laceration.       Arms:      Legs: Neurological: He is alert and oriented to person, place, and time.  Skin: Skin is warm and dry.  Nursing note and vitals reviewed.   ED Course  Procedures (including critical care time) Labs Review Labs Reviewed - No data to display  Imaging Review Dg Thoracic Spine 2 View  01/10/2015  CLINICAL DATA:  Thoracic spine pain status post fall.  EXAM: THORACIC SPINE - 2 VIEW  COMPARISON:  None.  FINDINGS: Multilevel degenerative changes. Gentle rightward curvature. Bridging anterior osteophytes and/or ligament ossification along the mid to lower thoracic vertebrae. The cervicothoracic junction and upper most thoracic vertebrae are poorly visualized due to overlapping structures on the attempted swimmer's view. Otherwise, no displaced acute fracture or malalignment.  IMPRESSION: Multilevel degenerative changes. Upper thoracic vertebrate poorly assessed. Otherwise, no acute osseous finding of the  thoracic spine.   Electronically Signed   By: Carlos Levering M.D.   On: 01/10/2015 00:25   Dg Lumbar Spine Complete  01/10/2015   CLINICAL DATA:  Low back pain status post fall.  EXAM: LUMBAR SPINE - COMPLETE 4+ VIEW  COMPARISON:  Contemporaneously thoracic spine  FINDINGS: Gentle leftward curvature of the lumbar spine. Multilevel degenerative disc disease and lower lumbar/ lumbosacral facet arthropathy. No displaced acute fracture or dislocation. 12 mm calcific density projecting over the right upper quadrant may reflect a stone or debris within colon. Similarly, 12 mm calcific density projects over the right iliac bone and may be within bowel versus bone island. Atherosclerotic vascular calcifications.  IMPRESSION: Multilevel degenerative changes of the lumbar spine. No acute osseous finding.   Electronically Signed   By: Carlos Levering M.D.   On: 01/10/2015 00:29   Dg Elbow Complete Left  01/10/2015   CLINICAL DATA:  Acute trauma, fall.  EXAM: LEFT ELBOW - COMPLETE 3+ VIEW  COMPARISON:  None.  FINDINGS: There is no evidence of fracture, dislocation, or joint effusion. There is no evidence of arthropathy or other focal bone abnormality. Soft tissues are unremarkable. Osteopenia noted.  IMPRESSION: No acute fracture or dislocation.   Electronically Signed   By: Jeannine Boga M.D.   On: 01/10/2015 00:04   Dg Knee Complete 4 Views Left  01/10/2015   CLINICAL DATA:  Knee pain and laceration over the patella status post fall.  EXAM: LEFT KNEE - COMPLETE 4+ VIEW  COMPARISON:  None.  FINDINGS: Meniscal chondrocalcinosis. Mild medial joint space narrowing. Curvilinear calcific density along the periphery of the lateral femoral condyle may reflect sequelae of remote ligamentous injury. No displaced acute fracture or dislocation. No overt joint effusion. Atherosclerotic vascular calcifications.  IMPRESSION: Chondrocalcinosis and mild medial joint space narrowing. No definite acute osseous finding.   Recommend a repeat radiograph in 7-10 days if concern for acute fracture persists, to evaluate for interval change or callus formation.   Electronically Signed   By: Carlos Levering M.D.   On: 01/10/2015 00:06     EKG Interpretation None      MDM   Final diagnoses:  Fall, initial encounter  Elbow avulsion, left, initial encounter  Knee abrasion, left, initial encounter    Patient with a recurrent fall. Did not hit head or neck. No neck pain. C-collar removed. Mild back pain but no signs of trauma, xrays negative. Review of records show his elbow avulsion/deep abrasion is similar to when he initially fell a few days ago, maybe mildly worse. Nothing amenable to repair. Of note, he was still in the paper scrubs he was discharged in 4 days ago. I made note of this to family at bedside and they will talk to nursing home. Clothes changed here. Stable for D/C home, has had further workup in last few visits, do not feel more imaging or labs are indicated.    Sherwood Gambler, MD 01/10/15 [phone]

## 2015-01-09 NOTE — ED Notes (Signed)
Bed: XI50 Expected date:  Expected time:  Means of arrival:  Comments: EMS 81 yo male from [place] with a fall/laceration, extremity pain

## 2015-01-09 NOTE — ED Notes (Signed)
Pt is from [facility] and had witnessed fall today. Lac on L elbow. Skin tear on L knee that is bandaged. C/o middle and lower back pain. No head trauma. No blood thinners. Alert and oriented per norm.

## 2015-01-09 NOTE — Progress Notes (Signed)
CSW attempted to meet with pt at bedside. However, he was not present. Sister states that pt has been transported to x-ray. She also confirms that pt is from [place], and has been there since last November. She states that the pt is in the independent unit. However, she says that she feels that the pt does not get enough assistance with completing his ADL's due to him refusing.  Sister informed CSW that the pt has fallen x4 in the last 10 days. She expressed concerns about the pt possibly needing a higher level of care. Also, she mentioned that the pt appears to be off balance lately and more irritated that normal. As of now, she says that the pt ambulates without a walker or cane.  Sister informed CSW that the pt's daughter is his primary support. She states that his daughter [name] is his [place].  Sister states that she does not have any questions at this time.  Daughter/Cheryl [phone] Omelia Blackwater [phone] ED CSW 01/09/2015 11:32 PM

## 2015-01-10 NOTE — Discharge Instructions (Signed)
Deep Skin Avulsion A deep skin avulsion is when all layers of the skin or parts of body structures have been torn away. This is usually a result of severe injury (trauma). A deep skin avulsion can include damage to important structures beneath the skin such as tendons, ligaments, nerves, or blood vessels.  CAUSES  Many injuries can lead to a deep skin avulsion. These include:   Crush injuries.  Bites.  Falls against jagged surfaces.  Gunshot wounds.  Severe burns and injuries involving dragging (such as those from a bicycle or motorcycle accident). TREATMENT   If the wound is small and there is no damage to vital structures like nerves and blood vessels, the damaged tissues may be removed. Then, the wound can be cleaned thoroughly and closed.  A skin graft may be performed. This is a procedure in which the outer layer of skin is removed from a different part of your body. That skin (skin graft) is used to cover the open wound. This can happen after damaged tissue is removed and repairs are completed.  Your caregiver may onlyapply a bandage (dressing) to the wound. The wound will be kept clean and allowed to heal. Healing can take weeks or months and usually leaves a large scar. This type of treatment is only done if your caregiver feels that skin grafting or a similar procedure would not work. You might need a tetanus shot if:  You cannot remember when you had your last tetanus shot.  You have never had a tetanus shot.  The injury broke your skin. If you got a tetanus shot, your arm may swell, get red, and feel warm to the touch. This is common and not a problem. If you need a tetanus shot and you choose not to have one, there is a rare chance of getting tetanus. Sickness from tetanus can be serious. HOME CARE INSTRUCTIONS   Only take over-the-counter or prescription medicines for pain, discomfort, or fever as directed by your caregiver.  Gently wash the area with mild soap and  water 2 times a day, or as directed. Rinse off the soap. Pat the area dry with a clean towel. Do not rub the wound. This may cause bleeding.  Follow your caregiver's instructions for how often you need to change the dressing.  Apply ointment and a dressing to the wound as directed.  If the dressing sticks, moisten it with soapy water and gently remove it.  Change the bandage right away if it becomes wet, dirty, or starts to smell bad.  Take showers. Do not take tub baths, swim, or do anything that may soak the wound until it is healed.  Use anti-itch medicine as directed by your caregiver. The wound may itch when it is healing. Do not pick or scratch at the wound.  Follow up with your caregiver for stitches (sutures), staple, or skin adhesive strip removal. SEEK MEDICAL CARE IF:   You have redness, swelling, or increasing pain in your wound.  A red streak or line extends away from the wound.  You have pus coming from the wound.  You notice a bad smell coming from thewound or dressing.  The wound breaks open (edges not staying together) after sutures have been removed.  You notice something coming out of the wound, such as a small piece of wood, glass, or metal.  You are unable to properly move a finger or toe if the wound is on your hand or foot.  You have severe  swelling around the wound that causes pain and numbness.  Your arm, hand, leg, or foot changes color. SEEK IMMEDIATE MEDICAL CARE IF:   Your pain becomes severe or is not adequately relieved with pain medicine.  You have a fever.  You have nausea and vomiting for more than 24 hours.  You feel lightheaded, weak, or faint.  You develop chest pain or difficulty breathing. MAKE SURE YOU:   Understand these instructions.  Will watch your condition.  Will get help right away if you are not doing well or get worse. Document Released: 11/05/2006 Document Revised: 12/02/2011 Document Reviewed:  01/13/2011 [facility] Patient Information 2015 [place], [place]. This information is not intended to replace advice given to you by your health care provider. Make sure you discuss any questions you have with your health care provider.    Fall Prevention and Home Safety Falls cause injuries and can affect all age groups. It is possible to use preventive measures to significantly decrease the likelihood of falls. There are many simple measures which can make your home safer and prevent falls. OUTDOORS  Repair cracks and edges of walkways and driveways.  Remove high doorway thresholds.  Trim shrubbery on the main path into your home.  Have good outside lighting.  Clear walkways of tools, rocks, debris, and clutter.  Check that handrails are not broken and are securely fastened. Both sides of steps should have handrails.  Have leaves, snow, and ice cleared regularly.  Use sand or salt on walkways during winter months.  In the garage, clean up grease or oil spills. BATHROOM  Install night lights.  Install grab bars by the toilet and in the tub and shower.  Use non-skid mats or decals in the tub or shower.  Place a plastic non-slip stool in the shower to sit on, if needed.  Keep floors dry and clean up all water on the floor immediately.  Remove soap buildup in the tub or shower on a regular basis.  Secure bath mats with non-slip, double-sided rug tape.  Remove throw rugs and tripping hazards from the floors. BEDROOMS  Install night lights.  Make sure a bedside light is easy to reach.  Do not use oversized bedding.  Keep a telephone by your bedside.  Have a firm chair with side arms to use for getting dressed.  Remove throw rugs and tripping hazards from the floor. KITCHEN  Keep handles on pots and pans turned toward the center of the stove. Use back burners when possible.  Clean up spills quickly and allow time for drying.  Avoid walking on wet floors.  Avoid  hot utensils and knives.  Position shelves so they are not too high or low.  Place commonly used objects within easy reach.  If necessary, use a sturdy step stool with a grab bar when reaching.  Keep electrical cables out of the way.  Do not use floor polish or wax that makes floors slippery. If you must use wax, use non-skid floor wax.  Remove throw rugs and tripping hazards from the floor. STAIRWAYS  Never leave objects on stairs.  Place handrails on both sides of stairways and use them. Fix any loose handrails. Make sure handrails on both sides of the stairways are as long as the stairs.  Check carpeting to make sure it is firmly attached along stairs. Make repairs to worn or loose carpet promptly.  Avoid placing throw rugs at the top or bottom of stairways, or properly secure the rug with  carpet tape to prevent slippage. Get rid of throw rugs, if possible.  Have an electrician put in a light switch at the top and bottom of the stairs. OTHER FALL PREVENTION TIPS  Wear low-heel or rubber-soled shoes that are supportive and fit well. Wear closed toe shoes.  When using a stepladder, make sure it is fully opened and both spreaders are firmly locked. Do not climb a closed stepladder.  Add color or contrast paint or tape to grab bars and handrails in your home. Place contrasting color strips on first and last steps.  Learn and use mobility aids as needed. Install an electrical emergency response system.  Turn on lights to avoid dark areas. Replace light bulbs that burn out immediately. Get light switches that glow.  Arrange furniture to create clear pathways. Keep furniture in the same place.  Firmly attach carpet with non-skid or double-sided tape.  Eliminate uneven floor surfaces.  Select a carpet pattern that does not visually hide the edge of steps.  Be aware of all pets. OTHER HOME SAFETY TIPS  Set the water temperature for 120 F (48.8 C).  Keep emergency numbers  on or near the telephone.  Keep smoke detectors on every level of the home and near sleeping areas. Document Released: 08/30/2002 Document Revised: 03/10/2012 Document Reviewed: 11/29/2011 [facility] Patient Information 2015 [place], [place]. This information is not intended to replace advice given to you by your health care provider. Make sure you discuss any questions you have with your health care provider.

## 2015-01-11 ENCOUNTER — Encounter (HOSPITAL_COMMUNITY): Payer: Self-pay

## 2015-01-11 ENCOUNTER — Emergency Department (HOSPITAL_COMMUNITY)
Admission: EM | Admit: 2015-01-11 | Discharge: 2015-01-11 | Disposition: A | Discharge status: Home / Self Care | Payer: Medicare Other | Attending: Emergency Medicine | Admitting: Emergency Medicine

## 2015-01-11 ENCOUNTER — Emergency Department (HOSPITAL_COMMUNITY): Payer: Medicare Other

## 2015-01-11 DIAGNOSIS — Y9289 Other specified places as the place of occurrence of the external cause: Secondary | ICD-10-CM | POA: Insufficient documentation

## 2015-01-11 DIAGNOSIS — M6281 Muscle weakness (generalized): Secondary | ICD-10-CM | POA: Diagnosis not present

## 2015-01-11 DIAGNOSIS — S42001A Fracture of unspecified part of right clavicle, initial encounter for closed fracture: Secondary | ICD-10-CM | POA: Diagnosis not present

## 2015-01-11 DIAGNOSIS — Y998 Other external cause status: Secondary | ICD-10-CM | POA: Diagnosis not present

## 2015-01-11 DIAGNOSIS — Y9389 Activity, other specified: Secondary | ICD-10-CM | POA: Diagnosis not present

## 2015-01-11 DIAGNOSIS — Z79899 Other long term (current) drug therapy: Secondary | ICD-10-CM | POA: Diagnosis not present

## 2015-01-11 DIAGNOSIS — W1839XA Other fall on same level, initial encounter: Secondary | ICD-10-CM | POA: Diagnosis not present

## 2015-01-11 DIAGNOSIS — R531 Weakness: Secondary | ICD-10-CM

## 2015-01-11 LAB — URINE MICROSCOPIC-ADD ON

## 2015-01-11 LAB — CBC WITH DIFFERENTIAL/PLATELET
BASOS PCT: 0 % (ref 0–1)
Basophils Absolute: 0 10*3/uL (ref 0.0–0.1)
EOS ABS: 0 10*3/uL (ref 0.0–0.7)
Eosinophils Relative: 0 % (ref 0–5)
HEMATOCRIT: 35 % — AB (ref 39.0–52.0)
Hemoglobin: 11.5 g/dL — ABNORMAL LOW (ref 13.0–17.0)
Lymphocytes Relative: 10 % — ABNORMAL LOW (ref 12–46)
Lymphs Abs: 0.9 10*3/uL (ref 0.7–4.0)
MCH: 30.3 pg (ref 26.0–34.0)
MCHC: 32.9 g/dL (ref 30.0–36.0)
MCV: 92.1 fL (ref 78.0–100.0)
MONO ABS: 0.9 10*3/uL (ref 0.1–1.0)
MONOS PCT: 10 % (ref 3–12)
Neutro Abs: 7.6 10*3/uL (ref 1.7–7.7)
Neutrophils Relative %: 80 % — ABNORMAL HIGH (ref 43–77)
Platelets: 235 10*3/uL (ref 150–400)
RBC: 3.8 MIL/uL — ABNORMAL LOW (ref 4.22–5.81)
RDW: 13.3 % (ref 11.5–15.5)
WBC: 9.5 10*3/uL (ref 4.0–10.5)

## 2015-01-11 LAB — COMPREHENSIVE METABOLIC PANEL
ALT: 17 U/L (ref 0–53)
AST: 38 U/L — ABNORMAL HIGH (ref 0–37)
Albumin: 3.5 g/dL (ref 3.5–5.2)
Alkaline Phosphatase: 56 U/L (ref 39–117)
Anion gap: 3 — ABNORMAL LOW (ref 5–15)
BUN: 27 mg/dL — ABNORMAL HIGH (ref 6–23)
CALCIUM: 8.3 mg/dL — AB (ref 8.4–10.5)
CO2: 29 mmol/L (ref 19–32)
CREATININE: 1.18 mg/dL (ref 0.50–1.35)
Chloride: 107 mmol/L (ref 96–112)
GFR calc Af Amer: 63 mL/min — ABNORMAL LOW (ref >90)
GFR calc non Af Amer: 54 mL/min — ABNORMAL LOW (ref >90)
GLUCOSE: 126 mg/dL — AB (ref 70–99)
Potassium: 3.9 mmol/L (ref 3.5–5.1)
SODIUM: 139 mmol/L (ref 135–145)
Total Bilirubin: 1.1 mg/dL (ref 0.3–1.2)
Total Protein: 6.6 g/dL (ref 6.0–8.3)

## 2015-01-11 LAB — URINALYSIS, ROUTINE W REFLEX MICROSCOPIC
GLUCOSE, UA: NEGATIVE mg/dL
Ketones, ur: 15 mg/dL — AB
LEUKOCYTES UA: NEGATIVE
Nitrite: NEGATIVE
PH: 5.5 (ref 5.0–8.0)
PROTEIN: 100 mg/dL — AB
SPECIFIC GRAVITY, URINE: 1.027 (ref 1.005–1.030)
Urobilinogen, UA: 1 mg/dL (ref 0.0–1.0)

## 2015-01-11 LAB — I-STAT TROPONIN, ED: Troponin i, poc: 0.02 ng/mL (ref 0.00–0.08)

## 2015-01-11 MED ORDER — SODIUM CHLORIDE 0.9 % IV BOLUS (SEPSIS)
500.0000 mL | Freq: Once | INTRAVENOUS | Status: AC
  Administered 2015-01-11: 500 mL via INTRAVENOUS

## 2015-01-11 NOTE — ED Notes (Signed)
Bed: WA16 Expected date:  Expected time:  Means of arrival:  Comments: EMS- Fall  

## 2015-01-11 NOTE — ED Notes (Signed)
Patient's daughter at bedside.  She expresses concern over patient's gait changes over the past four weeks.  States his confusion has also worsened.

## 2015-01-11 NOTE — ED Provider Notes (Signed)
D/w daughter via phone She feels comfortable with d/c back to [facility]   Ripley Fraise, MD 01/11/15 [phone]

## 2015-01-11 NOTE — ED Provider Notes (Signed)
Seen by social work Cleared for discharge and will have higher nursing support at facility  Ripley Fraise, MD 01/11/15 [phone]

## 2015-01-11 NOTE — ED Notes (Signed)
Patient arrives by EMS after SNF staff stated patient has fallen x 4 this past week and has increased weakness

## 2015-01-11 NOTE — ED Provider Notes (Signed)
TIME SEEN: 4:30 AM  CHIEF COMPLAINT: Generalized weakness, frequent falls  HPI: Pt is a 81 y.o. male with history of dementia who presents from [place] assisted living facility with concerns for generalized weakness and frequent falls. Patient's daughter at bedside provides most of the history. She is a ICU nurse here at [facility]. She reports over the past several weeks patient has been rapidly declining. She reports that he has not been performing his ADLs normally. She reports that he is having difficulty dressing himself, bathing. He has had multiple falls over the past several weeks. This is his fifth visit to the emergency department for the same. Last fall was 2 days ago. Daughter denies that he has had any known fevers, vomiting or diarrhea, cough. He has had multiple skin tears and has been started on Keflex yesterday to prevent possible infection. No other new medications.  PCP is Dr. Minna Antis  ROS:  Level V caveat for dementia  PAST MEDICAL HISTORY/PAST SURGICAL HISTORY:  Past Medical History  Diagnosis Date  . Chronic kidney disease   . Cancer     prostate    MEDICATIONS:  Prior to Admission medications   Medication Sig Start Date End Date Taking? Authorizing Provider  cephALEXin (KEFLEX) 500 MG capsule Take 1 capsule (500 mg total) by mouth 4 (four) times daily. Patient not taking: Reported on 01/02/2015 08/02/14   Cleatrice Burke, PA-C  furosemide (LASIX) 20 MG tablet Take 20 mg by mouth daily.  07/04/14   Historical Provider, MD    ALLERGIES:  No Known Allergies  SOCIAL HISTORY:  History  Substance Use Topics  . Smoking status: Not on file  . Smokeless tobacco: Not on file  . Alcohol Use: No    FAMILY HISTORY: No family history on file.  EXAM: BP 152/74 mmHg  Pulse 77  Temp(Src) 98.7 F (37.1 C) (Oral)  Resp 18  SpO2 95% CONSTITUTIONAL: Alert and oriented to person but not place and time, elderly, in no distress HEAD: Normocephalic EYES: Conjunctivae  clear, PERRL ENT: normal nose; no rhinorrhea; dry mucous membranes; pharynx without lesions noted NECK: Supple, no meningismus, no LAD  CARD: RRR; S1 and S2 appreciated; no murmurs, no clicks, no rubs, no gallops RESP: Normal chest excursion without splinting or tachypnea; breath sounds clear and equal bilaterally; no wheezes, no rhonchi, no rales, patient does have mild hypoxia with sleeping but will quickly come back to 100% on room air when he is awake ABD/GI: Normal bowel sounds; non-distended; soft, non-tender, no rebound, no guarding BACK:  The back appears normal and is non-tender to palpation, there is no CVA tenderness, no midline spinal tenderness or step-off or deformity EXT: Normal ROM in all joints; non-tender to palpation; no edema; normal capillary refill; no cyanosis, multiple skin tears, venous stasis dermatitis to bilateral lower extreme is, 2+ radial and DP pulses bilaterally, deformity of the right clavicle without tenting of the skin    SKIN: Normal color for age and race; warm NEURO: Moves all extremities equally, no pronator drift, unable to test sensation given patient's dementia, patient does have mild resting tremor in bilateral upper and extremities, cranial nerves II through XII intact PSYCH: The patient's mood and manner are appropriate. Grooming and personal hygiene are appropriate.  MEDICAL DECISION MAKING: Patient here with generalized weakness. Daughter was concerned because he had a difficult time getting off of the toilet tonight. She states that he has been advised to use a cane and walker but refuses to use them.  On April 15 patient had a normal CBC, BMP, troponin, urinalysis, head and cervical spine CT, chest x-ray. She reports that he has been eating well. She was concerned that there were Be something organic causing his symptoms and had him back to the emergency department. She does not feel that they are able to care for him in the assisted living facility and  feels he may need more care, upgrade to a skilled nursing facility. We'll repeat labs, urine, chest x-ray today. Vital signs normal. Rectal temp is 99.8.  ED PROGRESS: Patient's labs are unremarkable. No significant change. Urine does show large hemoglobin but no other sign of infection. He did have to have a catheterized urine specimen. Culture is pending. Troponin negative. EKG shows no ischemic changes. Chest x-ray shows right clavicle fracture. Will place in sling.  Updated patient's daughter [name] in the ICU (patient's power of attorney). She states she will have her aunt and sister come to the emergency department this morning. We will consult case management to evaluate the patient in the morning to see if we can place him in the skilled nursing area of [facility].  At this time I do not see any medical cause for his generalized weakness. Discussed with her that this may be failure to thrive and progression of his chronic illness. She is concerned about Lewy body dementia. Have discussed with her that this would be an outpatient follow-up. Will give outpatient neurology information. Have advised her to follow-up with her primary care doctor.  Patient's daughter is comfortable with this plan.     EKG Interpretation  Date/Time:  Wednesday January 11 2015 05:23:21 EDT Ventricular Rate:  82 PR Interval:    QRS Duration: 88 QT Interval:  397 QTC Calculation: 464 R Axis:   69 Text Interpretation:  Atrial fibrillation Artifact No significant change since last tracing Confirmed by Miley Lindon,  DO, Remi Rester [id number]) on 01/11/2015 5:59:23 AM        [place], DO 01/11/15 [id number]

## 2015-01-11 NOTE — Discharge Instructions (Signed)
Weakness Weakness is a lack of strength. It may be felt all over the body (generalized) or in one specific part of the body (focal). Some causes of weakness can be serious. You may need further medical evaluation, especially if you are elderly or you have a history of immunosuppression (such as chemotherapy or HIV), kidney disease, heart disease, or diabetes. CAUSES  Weakness can be caused by many different things, including:  Infection.  Physical exhaustion.  Internal bleeding or other blood loss that results in a lack of red blood cells (anemia).  Dehydration. This cause is more common in elderly people.  Side effects or electrolyte abnormalities from medicines, such as pain medicines or sedatives.  Emotional distress, anxiety, or depression.  Circulation problems, especially severe peripheral arterial disease.  Heart disease, such as rapid atrial fibrillation, bradycardia, or heart failure.  Nervous system disorders, such as Guillain-Barr syndrome, multiple sclerosis, or stroke. DIAGNOSIS  To find the cause of your weakness, your caregiver will take your history and perform a physical exam. Lab tests or X-rays may also be ordered, if needed. TREATMENT  Treatment of weakness depends on the cause of your symptoms and can vary greatly. HOME CARE INSTRUCTIONS   Rest as needed.  Eat a well-balanced diet.  Try to get some exercise every day.  Only take over-the-counter or prescription medicines as directed by your caregiver. SEEK MEDICAL CARE IF:   Your weakness seems to be getting worse or spreads to other parts of your body.  You develop new aches or pains. SEEK IMMEDIATE MEDICAL CARE IF:   You cannot perform your normal daily activities, such as getting dressed and feeding yourself.  You cannot walk up and down stairs, or you feel exhausted when you do so.  You have shortness of breath or chest pain.  You have difficulty moving parts of your body.  You have weakness  in only one area of the body or on only one side of the body.  You have a fever.  You have trouble speaking or swallowing.  You cannot control your bladder or bowel movements.  You have Belay or bloody vomit or stools. MAKE SURE YOU:  Understand these instructions.  Will watch your condition.  Will get help right away if you are not doing well or get worse. Document Released: 09/09/2005 Document Revised: 03/10/2012 Document Reviewed: 11/08/2011 [facility] Patient Information 2015 [place], [place]. This information is not intended to replace advice given to you by your health care provider. Make sure you discuss any questions you have with your health care provider.   Failure to Thrive, Adult Adult failure to thrive is a condition that some older people develop. People with this condition are able to do fewer and fewer activities over time. They may lose interest in being with friends or may not want to eat or drink. This is not a normal part of aging. Many things can cause this. Health problems, long-term disease, depression, bad eating habits, cognitive impairment, or disability may play a role in the development of this condition. Most of the time, it is important to treat whatever is causing failure to thrive. Sometimes, though, it might not be possible to treat the condition. The person could be nearing the end of life. Then, treatment could make the person suffer longer. CAUSES Sometimes, no specific cause can be found. Factors that have been linked to failure to thrive include:  Diseases and medical conditions, such as:  Cancer.  Diabetes.  Stomach and intestinal (gastrointestinal) problems.  Lung disease.  Liver disease.  Kidney disease.  Heart problems.  Thyroid disease.  Neurologic problems.  Vitamin deficiencies.  Disability. This may be the result of:  A broken hip.  A stroke.  Very bad arthritis.  Infections that last a long time.  A long recovery  from a surgery.  Mental health issues.  Medicines.  Eating problems.  Medicine for certain conditions. These conditions include:  Parkinson's disease.  Seizure disorder.  Anxiety.  Pain.  High blood pressure.  Depression.  Infections. SYMPTOMS  Losing weight (more than 5% of total body weight).  Getting more tired than usual after an activity.  Having trouble getting up after sitting.  Not being hungry or thirsty.  Not getting out of bed.  Not wanting to do usual activities.  Being depressed.  Getting infections often.  Having bedsores.  Taking a long time to recover after an injury or a surgery.  Weakness. DIAGNOSIS A physical exam can help a caregiver decide if someone has adult failure to thrive. This may include questions about the person's health presently and in the past. It also may include questions about behavior and mood, such as:  Has activity changed?  Does the person seem sad?  Are eating habits different? The caregiver may ask for a list of all medicines taken because certain medicines can lead to this condition. The list should include prescription and over-the-counter medicines. The caregiver will likely order some tests. These may include:  Blood tests to check for infection, certain diseases, deficiencies, hormone levels, malnutrition, or dehydration.  Urine tests to check for urinary tract infection or kidney failure.  Imaging tests. Examples are an X-ray, a computed tomography (CT) scan, and magnetic resonance imaging (MRI).  Hearing tests.  Vision tests.  Cognitivetests to check thinking ability.  Activity tests to see if the person can do basic tasks like bathing and dressing. There also are tests to check if someone can shop, cook, or move around safely. The caregiver will check if the person is eating enough healthy food. This may include:  Checking weight.  Having blood tested for cholesterol and protein  levels.  Seeing if anything else might be making it hard to eat (tooth problems, poorly fitted dentures, trouble swallowing). The person may need to see a specialist to help with diagnosis or treatment. These specialists may include a speech therapist, physical therapist, occupational therapist, dietitian, or social worker.  TREATMENT Treatment for adult failure to thrive depends on the cause. Caregivers also must decide if a treatment has a good chance of working. It often takes a team of caregivers to find the right treatment. Options may include:  Treatments to cure a disease that can cause adult failure to thrive.  Talk therapy or medicine to treat depression.  A better diet. Eating more often, adding nutritional supplements between meals, or taking vitamins may be suggested. Sometimes, medicine is prescribed to boost appetite.  Medicine changes or stopping a medicine.  Physical therapy.  Moving to a place that offers more aid. HOME CARE INSTRUCTIONS What needs to be done at home varies from person to person. This will depend on what caused the condition and how it is treated. However, basic guidelines include:  Taking any medicine prescribed by the caregiver. Following the directions carefully is important.  Eating healthy foods. There should be enough calories in each meal. Ask the caregiver if vitamins or nutritional supplements should be taken between meals. Consider talking with a dietitian.  Exercising. Strength training  is important. A physical therapist can help set up an exercise program that fits the person.  Making sure the person is safe at home.  Talking with caregivers about what should be done if the person can no longer make decisions for himself or herself. SEEK MEDICAL CARE IF:  There are any questions about medicines.  There are questions about the effects of treatment.  The person is not able to eat well.  The person is not able to move around.  The  person feels very sad or hopeless. SEEK IMMEDIATE MEDICAL CARE IF:   The person has thoughts of ending his or her life.  The person cannot eat or drink.  The person does not get out of bed.  Staying at home is no longer safe.  The person has a fever. Document Released: 12/02/2011 Document Reviewed: 12/02/2011 [facility] Patient Information 2015 [place]. This information is not intended to replace advice given to you by your health care provider. Make sure you discuss any questions you have with your health care provider.  Dementia Dementia is a general term for problems with brain function. A person with dementia has memory loss and a hard time with at least one other brain function such as thinking, speaking, or problem solving. Dementia can affect social functioning, how you do your job, your mood, or your personality. The changes may be hidden for a long time. The earliest forms of this disease are usually not detected by family or friends. Dementia can be:  Irreversible.  Potentially reversible.  Partially reversible.  Progressive. This means it can get worse over time. CAUSES  Irreversible dementia causes may include:  Degeneration of brain cells (Alzheimer disease or Lewy body dementia).  Multiple small strokes (vascular dementia).  Infection (chronic meningitis or Creutzfeldt-Jakob disease).  Frontotemporal dementia. This affects younger people, age 86 to 39, compared to those who have Alzheimer disease.  Dementia associated with other disorders like Parkinson disease, Huntington disease, or HIV-associated dementia. Potentially or partially reversible dementia causes may include:  Medicines.  Metabolic causes such as excessive alcohol intake, vitamin B12 deficiency, or thyroid disease.  Masses or pressure in the brain such as a tumor, blood clot, or hydrocephalus. SIGNS AND SYMPTOMS  Symptoms are often hard to detect. Family members or coworkers may not  notice them early in the disease process. Different people with dementia may have different symptoms. Symptoms can include:  A hard time with memory, especially recent memory. Long-term memory may not be impaired.  Asking the same question multiple times or forgetting something someone just said.  A hard time speaking your thoughts or finding certain words.  A hard time solving problems or performing familiar tasks (such as how to use a telephone).  Sudden changes in mood.  Changes in personality, especially increasing moodiness or mistrust.  Depression.  A hard time understanding complex ideas that were never a problem in the past. DIAGNOSIS  There are no specific tests for dementia.   Your health care provider may recommend a thorough evaluation. This is because some forms of dementia can be reversible. The evaluation will likely include a physical exam and getting a detailed history from you and a family member. The history often gives the best clues and suggestions for a diagnosis.  Memory testing may be done. A detailed brain function evaluation called neuropsychologic testing may be helpful.  Lab tests and brain imaging (such as a CT scan or MRI scan) are sometimes important.  Sometimes observation and  re-evaluation over time is very helpful. TREATMENT  Treatment depends on the cause.   If the problem is a vitamin deficiency, it may be helped or cured with supplements.  For dementias such as Alzheimer disease, medicines are available to stabilize or slow the course of the disease. There are no cures for this type of dementia.  Your health care provider can help direct you to groups, organizations, and other health care providers to help with decisions in the care of you or your loved one. HOME CARE INSTRUCTIONS The care of individuals with dementia is varied and dependent upon the progression of the dementia. The following suggestions are intended for the person living with,  or caring for, the person with dementia.  Create a safe environment.  Remove the locks on bathroom doors to prevent the person from accidentally locking himself or herself in.  Use childproof latches on kitchen cabinets and any place where cleaning supplies, chemicals, or alcohol are kept.  Use childproof covers in unused electrical outlets.  Install childproof devices to keep doors and windows secured.  Remove stove knobs or install safety knobs and an automatic shut-off on the stove.  Lower the temperature on water heaters.  Label medicines and keep them locked up.  Secure knives, lighters, matches, power tools, and guns, and keep these items out of reach.  Keep the house free from clutter. Remove rugs or anything that might contribute to a fall.  Remove objects that might break and hurt the person.  Make sure lighting is good, both inside and outside.  Install grab rails as needed.  Use a monitoring device to alert you to falls or other needs for help.  Reduce confusion.  Keep familiar objects and people around.  Use night lights or dim lights at night.  Label items or areas.  Use reminders, notes, or directions for daily activities or tasks.  Keep a simple, consistent routine for waking, meals, bathing, dressing, and bedtime.  Create a calm, quiet environment.  Place large clocks and calendars prominently.  Display emergency numbers and home address near all telephones.  Use cues to establish different times of the day. An example is to open curtains to let the natural light in during the day.   Use effective communication.  Choose simple words and short sentences.  Use a gentle, calm tone of voice.  Be careful not to interrupt.  If the person is struggling to find a word or communicate a thought, try to provide the word or thought.  Ask one question at a time. Allow the person ample time to answer questions. Repeat the question again if the person  does not respond.  Reduce nighttime restlessness.  Provide a comfortable bed.  Have a consistent nighttime routine.  Ensure a regular walking or physical activity schedule. Involve the person in daily activities as much as possible.  Limit napping during the day.  Limit caffeine.  Attend social events that stimulate rather than overwhelm the senses.  Encourage good nutrition and hydration.  Reduce distractions during meal times and snacks.  Avoid foods that are too hot or too cold.  Monitor chewing and swallowing ability.  Continue with routine vision, hearing, dental, and medical screenings.  Give medicines only as directed by the health care provider.  Monitor driving abilities. Do not allow the person to drive when safe driving is no longer possible.  Register with an identification program which could provide location assistance in the event of a missing person situation. SEEK  MEDICAL CARE IF:   New behavioral problems start such as moodiness, aggressiveness, or seeing things that are not there (hallucinations).  Any new problem with brain function happens. This includes problems with balance, speech, or falling a lot.  Problems with swallowing develop.  Any symptoms of other illness happen. Small changes or worsening in any aspect of brain function can be a sign that the illness is getting worse. It can also be a sign of another medical illness such as infection. Seeing a health care provider right away is important. SEEK IMMEDIATE MEDICAL CARE IF:   A fever develops.  New or worsened confusion develops.  New or worsened sleepiness develops.  Staying awake becomes hard to do. Document Released: 03/05/2001 Document Revised: 01/24/2014 Document Reviewed: 02/04/2011 [facility] Patient Information 2015 [place], [place]. This information is not intended to replace advice given to you by your health care provider. Make sure you discuss any questions you have with your  health care provider.   Clavicle Fracture The clavicle, also called the collarbone, is the long bone that connects your shoulder to your rib cage. You can feel your collarbone at the top of your shoulders and rib cage. A clavicle fracture is a broken clavicle. It is a common injury that can happen at any age.  CAUSES Common causes of a clavicle fracture include:  A direct blow to your shoulder.  A car accident.  A fall, especially if you try to break your fall with an outstretched arm. RISK FACTORS You may be at increased risk if:  You are younger than 25 years or older than 77 years. Most clavicle fractures happen to people who are younger than 25 years.  You are a male.  You play contact sports. SIGNS AND SYMPTOMS A fractured clavicle is painful. It also makes it hard to move your arm. Other signs and symptoms may include:  A shoulder that drops downward and forward.  Pain when trying to lift your shoulder.  Bruising, swelling, and tenderness over your clavicle.  A grinding noise when you try to move your shoulder.  A bump over your clavicle. DIAGNOSIS Your health care provider can usually diagnose a clavicle fracture by asking about your injury and examining your shoulder and clavicle. He or she may take an X-ray to determine the position of your clavicle. TREATMENT Treatment depends on the position of your clavicle after the fracture:  If the broken ends of the bone are not out of place, your health care provider may put your arm in a sling or wrap a support bandage around your chest (figure-of-eight wrap).  If the broken ends of the bone are out of place, you may need surgery. Surgery may involve placing screws, pins, or plates to keep your clavicle stable while it heals. Healing may take about 3 months. When your health care provider thinks your fracture has healed enough, you may have to do physical therapy to regain normal movement and build up your arm  strength. HOME CARE INSTRUCTIONS   Apply ice to the injured area:  Put ice in a plastic bag.  Place a towel between your skin and the bag.  Leave the ice on for 20 minutes, 2-3 times a day.  If you have a wrap or splint:  Wear it all the time, and remove it only to take a bath or shower.  When you bathe or shower, keep your shoulder in the same position as when the sling or wrap is on.  Do not  lift your arm.  If you have a figure-of-eight wrap:  Another person must tighten it every day.  It should be tight enough to hold your shoulders back.  Allow enough room to place your index finger between your body and the strap.  Loosen the wrap immediately if you feel numbness or tingling in your hands.  Only take medicines as directed by your health care provider.  Avoid activities that make the injury or pain worse for 4-6 weeks after surgery.  Keep all follow-up appointments. SEEK MEDICAL CARE IF:  Your medicine is not helping to relieve pain and swelling. SEEK IMMEDIATE MEDICAL CARE IF:  Your arm is numb, cold, or pale, even when the splint is loose. MAKE SURE YOU:   Understand these instructions.  Will watch your condition.  Will get help right away if you are not doing well or get worse. Document Released: 06/19/2005 Document Revised: 09/14/2013 Document Reviewed: 08/02/2013 [facility] Patient Information 2015 [place], [place]. This information is not intended to replace advice given to you by your health care provider. Make sure you discuss any questions you have with your health care provider.

## 2015-01-11 NOTE — ED Notes (Signed)
[facility] called for transport back to facility 

## 2015-01-11 NOTE — ED Notes (Signed)
pts daughter, [name] [phone]  Pts other daughter [phone]

## 2015-01-11 NOTE — Progress Notes (Signed)
CSW met with pt daughter, Evan Maynard regarding pt disposition. After further discussion, pt daughter plans for patient to return to [facility] on [address] at [address], and receive private duty services to sit with patient for closer observation, increase observation that can be provided by ALF facility, and to consult with [facility]. CSW will refer patient to [facility] and they will follow up with pt daughter Evan Maynard or other daughter Evan Maynard at [phone]. CSW will inform RN and EDP.  Consult for [facility] of [facility] in discharge paperwork.   Belia Heman, [place] [employer]  [employer] [phone]

## 2015-01-12 LAB — URINE CULTURE
CULTURE: NO GROWTH
Colony Count: NO GROWTH

## 2015-02-26 ENCOUNTER — Emergency Department (HOSPITAL_COMMUNITY): Payer: Medicare Other

## 2015-02-26 ENCOUNTER — Encounter (HOSPITAL_COMMUNITY): Payer: Self-pay | Admitting: Emergency Medicine

## 2015-02-26 ENCOUNTER — Emergency Department (HOSPITAL_COMMUNITY)
Admission: EM | Admit: 2015-02-26 | Discharge: 2015-02-27 | Disposition: A | Discharge status: Home / Self Care | Payer: Medicare Other | Attending: Emergency Medicine | Admitting: Emergency Medicine

## 2015-02-26 DIAGNOSIS — Y92129 Unspecified place in nursing home as the place of occurrence of the external cause: Secondary | ICD-10-CM | POA: Diagnosis not present

## 2015-02-26 DIAGNOSIS — W1839XA Other fall on same level, initial encounter: Secondary | ICD-10-CM | POA: Diagnosis not present

## 2015-02-26 DIAGNOSIS — Y939 Activity, unspecified: Secondary | ICD-10-CM | POA: Diagnosis not present

## 2015-02-26 DIAGNOSIS — Z8546 Personal history of malignant neoplasm of prostate: Secondary | ICD-10-CM | POA: Diagnosis not present

## 2015-02-26 DIAGNOSIS — S61412A Laceration without foreign body of left hand, initial encounter: Secondary | ICD-10-CM | POA: Diagnosis not present

## 2015-02-26 DIAGNOSIS — S81011A Laceration without foreign body, right knee, initial encounter: Secondary | ICD-10-CM | POA: Diagnosis not present

## 2015-02-26 DIAGNOSIS — Z79899 Other long term (current) drug therapy: Secondary | ICD-10-CM | POA: Insufficient documentation

## 2015-02-26 DIAGNOSIS — S022XXA Fracture of nasal bones, initial encounter for closed fracture: Secondary | ICD-10-CM | POA: Diagnosis not present

## 2015-02-26 DIAGNOSIS — W19XXXA Unspecified fall, initial encounter: Secondary | ICD-10-CM

## 2015-02-26 DIAGNOSIS — S0993XA Unspecified injury of face, initial encounter: Secondary | ICD-10-CM | POA: Diagnosis present

## 2015-02-26 DIAGNOSIS — Y999 Unspecified external cause status: Secondary | ICD-10-CM | POA: Insufficient documentation

## 2015-02-26 DIAGNOSIS — N189 Chronic kidney disease, unspecified: Secondary | ICD-10-CM | POA: Insufficient documentation

## 2015-02-26 NOTE — ED Notes (Signed)
PER EMS- pt picked up from [place] facility c/o unwitnessed fall.  Nose deformity noted with multiple skin tears.  Pt denies head injury or LOC. Pt has hx of dementia.  Arrived to ED alert and oriented per baseline. Denies pain at this time.

## 2015-02-26 NOTE — ED Notes (Signed)
Bed: WA09 Expected date:  Expected time:  Means of arrival:  Comments:  EMS 54M Fall/facial injuries

## 2015-02-26 NOTE — ED Provider Notes (Signed)
CSN: [id number]     Arrival date & time 02/26/15  2038 History   First MD Initiated Contact with Patient 02/26/15 2057     Chief Complaint  Patient presents with  . Fall  . Facial Injury     (Consider location/radiation/quality/duration/timing/severity/associated sxs/prior Treatment) HPI Comments: Patient is an 81 year old male with history of chronic renal insufficiency, CHF. He is brought by EMS from an extended care facility for evaluation of a fall. This fall was apparently unwitnessed and he was found on the floor with injuries to his nose, left hand, and right knee. He appears to have baseline dementia which makes his history somewhat difficult.  Patient is a 81 y.o. male presenting with fall. The history is provided by the patient.  Fall This is a new problem. The current episode started less than 1 hour ago. The problem occurs constantly. The problem has not changed since onset.Pertinent negatives include no chest pain and no abdominal pain. Nothing aggravates the symptoms. Nothing relieves the symptoms. He has tried nothing for the symptoms. The treatment provided no relief.    Past Medical History  Diagnosis Date  . Chronic kidney disease   . Cancer     prostate   History reviewed. No pertinent past surgical history. History reviewed. No pertinent family history. History  Substance Use Topics  . Smoking status: Never Smoker   . Smokeless tobacco: Not on file  . Alcohol Use: No    Review of Systems  Cardiovascular: Negative for chest pain.  Gastrointestinal: Negative for abdominal pain.  All other systems reviewed and are negative.     Allergies  Review of patient's allergies indicates no known allergies.  Home Medications   Prior to Admission medications   Medication Sig Start Date End Date Taking? Authorizing Provider  cephALEXin (KEFLEX) 500 MG capsule Take 1 capsule (500 mg total) by mouth 4 (four) times daily. Patient not taking: Reported on 01/02/2015  08/02/14   Cleatrice Burke, PA-C  furosemide (LASIX) 20 MG tablet Take 20 mg by mouth daily.  07/04/14   Historical Provider, MD   BP 138/69 mmHg  Pulse 76  Temp(Src) 97.9 F (36.6 C) (Oral)  Resp 20  SpO2 100% Physical Exam  Constitutional: He is oriented to person, place, and time. He appears well-developed and well-nourished. No distress.  HENT:  Head: Normocephalic.  Patient has an obvious deformity to the nose. There is rightward deviation. There is no septal hematoma. There is blood in both nares, however no apparent active bleeding. There is a skin tear to the bridge of the nose. There is some oozing of blood.  Eyes: EOM are normal. Pupils are equal, round, and reactive to light.  Neck: Normal range of motion. Neck supple.  There is no cervical spine step-off or bony tenderness.  Cardiovascular: Normal rate, regular rhythm and normal heart sounds.   No murmur heard. Pulmonary/Chest: Effort normal and breath sounds normal. No respiratory distress. He has no wheezes.  Abdominal: Soft. Bowel sounds are normal. He exhibits no distension. There is no tenderness.  Musculoskeletal:  There is a skin tear to the ulnar aspect of the left hand. There is no obvious deformity or significant swelling.  There is a skin tear to the anterior aspect of the right knee overlying the patella. There is no significant swelling or deformity or effusion.  Neurological: He is alert and oriented to person, place, and time.  Skin: Skin is warm and dry. He is not diaphoretic.  Nursing note and  vitals reviewed.   ED Course  Procedures (including critical care time) Labs Review Labs Reviewed - No data to display  Imaging Review No results found.   EKG Interpretation None      MDM   Final diagnoses:  None    CT scans of the head, maxillofacial, and cervical spine reveal comminuted nasal bone fractures, however no other acute injury. He will be discharged to home with follow-up with ear nose  and throat, local wound care to the skin tears to his left hand and right knee.    Veryl Speak, MD 02/27/15 0005

## 2015-02-26 NOTE — ED Notes (Signed)
MD at bedside. 

## 2015-02-26 NOTE — ED Notes (Signed)
Patient transported to CT 

## 2015-02-27 MED ORDER — CEPHALEXIN 500 MG PO CAPS: 500.0000 mg | ORAL_CAPSULE | Freq: Four times a day (QID) | ORAL | Status: DC

## 2015-02-27 MED ORDER — BACITRACIN ZINC 500 UNIT/GM EX OINT
TOPICAL_OINTMENT | CUTANEOUS | Status: AC
  Administered 2015-02-27: 3
  Filled 2015-02-27: qty 2.7

## 2015-02-27 NOTE — ED Notes (Signed)
Patient picked up by [facility].

## 2015-02-27 NOTE — ED Notes (Signed)
Patient with multiple skin tears to arms and legs. New skin tears dressed with bacitracin per MD request, sterile guaze, and tegaderm.

## 2015-02-27 NOTE — ED Notes (Signed)
Patient assisted with dressing and moved to hallway bed for increased visualization.

## 2015-02-27 NOTE — ED Notes (Signed)
Requested [facility] for transport

## 2015-02-27 NOTE — Discharge Instructions (Signed)
°  Keflex as prescribed.  Local wound care to skin tears with bacitracin and dressing changes twice daily.  Follow-up with ear nose and throat in the next 2-3 days for reevaluation of your nasal bone fractures. The contact information for Dr. Simeon Craft has been provided in this discharge summary for you to call and arrange this appointment.   Nasal Fracture A nasal fracture is a break or crack in the bones of the nose. A minor break usually heals in a month. You often will receive Lattanzio eyes from a nasal fracture. This is not a cause for concern. The Fredericks eyes will go away over 1 to 2 weeks.  DIAGNOSIS  Your caregiver may want to examine you if you are concerned about a fracture of the nose. X-rays of the nose may not show a nasal fracture even when one is present. Sometimes your caregiver must wait 1 to 5 days after the injury to re-check the nose for alignment and to take additional X-rays. Sometimes the caregiver must wait until the swelling has gone down. TREATMENT Minor fractures that have caused no deformity often do not require treatment. More serious fractures where bones are displaced may require surgery. This will take place after the swelling is gone. Surgery will stabilize and align the fracture. HOME CARE INSTRUCTIONS   Put ice on the injured area.  Put ice in a plastic bag.  Place a towel between your skin and the bag.  Leave the ice on for 15-20 minutes, 03-04 times a day.  Take medications as directed by your caregiver.  Only take over-the-counter or prescription medicines for pain, discomfort, or fever as directed by your caregiver.  If your nose starts bleeding, squeeze the soft parts of the nose against the center wall while you are sitting in an upright position for 10 minutes.  Contact sports should be avoided for at least 3 to 4 weeks or as directed by your caregiver. SEEK MEDICAL CARE IF:  Your pain increases or becomes severe.  You continue to have  nosebleeds.  The shape of your nose does not return to normal within 5 days.  You have pus draining from the nose. SEEK IMMEDIATE MEDICAL CARE IF:   You have bleeding from your nose that does not stop after 20 minutes of pinching the nostrils closed and keeping ice on the nose.  You have clear fluid draining from your nose.  You notice a grape-like swelling on the dividing wall between the nostrils (septum). This is a collection of blood (hematoma) that must be drained to help prevent infection.  You have difficulty moving your eyes.  You have recurrent vomiting. Document Released: 09/06/2000 Document Revised: 12/02/2011 Document Reviewed: 12/24/2010 [facility] Patient Information 2015 [place], [place]. This information is not intended to replace advice given to you by your health care provider. Make sure you discuss any questions you have with your health care provider.

## 2015-03-28 ENCOUNTER — Emergency Department (HOSPITAL_COMMUNITY): Payer: Medicare Other

## 2015-03-28 ENCOUNTER — Encounter (HOSPITAL_COMMUNITY): Payer: Self-pay | Admitting: Emergency Medicine

## 2015-03-28 ENCOUNTER — Emergency Department (HOSPITAL_COMMUNITY)
Admission: EM | Admit: 2015-03-28 | Discharge: 2015-03-28 | Disposition: A | Discharge status: Home / Self Care | Payer: Medicare Other | Attending: Emergency Medicine | Admitting: Emergency Medicine

## 2015-03-28 DIAGNOSIS — Z79899 Other long term (current) drug therapy: Secondary | ICD-10-CM | POA: Insufficient documentation

## 2015-03-28 DIAGNOSIS — R911 Solitary pulmonary nodule: Secondary | ICD-10-CM

## 2015-03-28 DIAGNOSIS — F0390 Unspecified dementia without behavioral disturbance: Secondary | ICD-10-CM | POA: Diagnosis not present

## 2015-03-28 DIAGNOSIS — W1839XA Other fall on same level, initial encounter: Secondary | ICD-10-CM | POA: Insufficient documentation

## 2015-03-28 DIAGNOSIS — Y939 Activity, unspecified: Secondary | ICD-10-CM | POA: Insufficient documentation

## 2015-03-28 DIAGNOSIS — S0101XA Laceration without foreign body of scalp, initial encounter: Secondary | ICD-10-CM

## 2015-03-28 DIAGNOSIS — W19XXXA Unspecified fall, initial encounter: Secondary | ICD-10-CM

## 2015-03-28 DIAGNOSIS — Y92129 Unspecified place in nursing home as the place of occurrence of the external cause: Secondary | ICD-10-CM | POA: Diagnosis not present

## 2015-03-28 DIAGNOSIS — Z8546 Personal history of malignant neoplasm of prostate: Secondary | ICD-10-CM | POA: Insufficient documentation

## 2015-03-28 DIAGNOSIS — N189 Chronic kidney disease, unspecified: Secondary | ICD-10-CM | POA: Diagnosis not present

## 2015-03-28 DIAGNOSIS — Y999 Unspecified external cause status: Secondary | ICD-10-CM | POA: Diagnosis not present

## 2015-03-28 DIAGNOSIS — S0990XA Unspecified injury of head, initial encounter: Secondary | ICD-10-CM | POA: Diagnosis present

## 2015-03-28 LAB — URINALYSIS, ROUTINE W REFLEX MICROSCOPIC
BILIRUBIN URINE: NEGATIVE
GLUCOSE, UA: NEGATIVE mg/dL
Ketones, ur: NEGATIVE mg/dL
Leukocytes, UA: NEGATIVE
Nitrite: NEGATIVE
PH: 5 (ref 5.0–8.0)
PROTEIN: NEGATIVE mg/dL
Specific Gravity, Urine: 1.011 (ref 1.005–1.030)
UROBILINOGEN UA: 0.2 mg/dL (ref 0.0–1.0)

## 2015-03-28 LAB — COMPREHENSIVE METABOLIC PANEL
ALT: 11 U/L — ABNORMAL LOW (ref 17–63)
AST: 22 U/L (ref 15–41)
Albumin: 3.7 g/dL (ref 3.5–5.0)
Alkaline Phosphatase: 78 U/L (ref 38–126)
Anion gap: 8 (ref 5–15)
BILIRUBIN TOTAL: 0.8 mg/dL (ref 0.3–1.2)
BUN: 30 mg/dL — AB (ref 6–20)
CALCIUM: 8.8 mg/dL — AB (ref 8.9–10.3)
CO2: 31 mmol/L (ref 22–32)
Chloride: 102 mmol/L (ref 101–111)
Creatinine, Ser: 1.16 mg/dL (ref 0.61–1.24)
GFR calc non Af Amer: 56 mL/min — ABNORMAL LOW (ref >60)
GLUCOSE: 111 mg/dL — AB (ref 65–99)
Potassium: 3.9 mmol/L (ref 3.5–5.1)
Sodium: 141 mmol/L (ref 135–145)
Total Protein: 6.9 g/dL (ref 6.5–8.1)

## 2015-03-28 LAB — CBC WITH DIFFERENTIAL/PLATELET
Basophils Absolute: 0 10*3/uL (ref 0.0–0.1)
Basophils Relative: 0 % (ref 0–1)
EOS PCT: 4 % (ref 0–5)
Eosinophils Absolute: 0.3 10*3/uL (ref 0.0–0.7)
HCT: 38.1 % — ABNORMAL LOW (ref 39.0–52.0)
Hemoglobin: 12 g/dL — ABNORMAL LOW (ref 13.0–17.0)
LYMPHS ABS: 1.3 10*3/uL (ref 0.7–4.0)
Lymphocytes Relative: 16 % (ref 12–46)
MCH: 28.5 pg (ref 26.0–34.0)
MCHC: 31.5 g/dL (ref 30.0–36.0)
MCV: 90.5 fL (ref 78.0–100.0)
MONO ABS: 0.9 10*3/uL (ref 0.1–1.0)
Monocytes Relative: 10 % (ref 3–12)
Neutro Abs: 5.9 10*3/uL (ref 1.7–7.7)
Neutrophils Relative %: 70 % (ref 43–77)
Platelets: 182 10*3/uL (ref 150–400)
RBC: 4.21 MIL/uL — ABNORMAL LOW (ref 4.22–5.81)
RDW: 13.9 % (ref 11.5–15.5)
WBC: 8.5 10*3/uL (ref 4.0–10.5)

## 2015-03-28 LAB — I-STAT CG4 LACTIC ACID, ED
Lactic Acid, Venous: 0.63 mmol/L (ref 0.5–2.0)
Lactic Acid, Venous: 0.91 mmol/L (ref 0.5–2.0)

## 2015-03-28 LAB — URINE MICROSCOPIC-ADD ON

## 2015-03-28 MED ORDER — BACITRACIN ZINC 500 UNIT/GM EX OINT
1.0000 "application " | TOPICAL_OINTMENT | Freq: Two times a day (BID) | CUTANEOUS | Status: DC
  Administered 2015-03-28: 1 via TOPICAL
  Filled 2015-03-28: qty 0.9

## 2015-03-28 NOTE — ED Provider Notes (Signed)
CSN: [id number]     Arrival date & time 03/28/15  1431 History   None    Chief Complaint  Patient presents with  . Fall  . Head Laceration     (Consider location/radiation/quality/duration/timing/severity/associated sxs/prior Treatment) HPI   Blood pressure 123/63, pulse 71, temperature 99.8 F (37.7 C), temperature source Oral, resp. rate 20, SpO2 94 %.  Evan Maynard is a 81 y.o. male from [facility] and of fall this morning. He has a laceration to the left temple, nursing home reports that he has a borderline temperature. Level V caveat secondary to dementia. Patient denies pain, cannot explain how he fell. Chart review shows patient's last tetanus shot was in 2015.   Past Medical History  Diagnosis Date  . Chronic kidney disease   . Cancer     prostate   History reviewed. No pertinent past surgical history. History reviewed. No pertinent family history. History  Substance Use Topics  . Smoking status: Never Smoker   . Smokeless tobacco: Not on file  . Alcohol Use: No    Review of Systems  10 systems reviewed and found to be negative, except as noted in the HPI.  Allergies  Review of patient's allergies indicates no known allergies.  Home Medications   Prior to Admission medications   Medication Sig Start Date End Date Taking? Authorizing Provider  acetaminophen (TYLENOL) 500 MG tablet Take 500 mg by mouth 3 (three) times daily as needed for moderate pain.   Yes Historical Provider, MD  furosemide (LASIX) 20 MG tablet Take 20 mg by mouth daily.  07/04/14  Yes Historical Provider, MD  Nutritional Supplements (NUTRITIONAL DRINK PO) Take 1 Container by mouth 3 (three) times daily between meals. Great shakes   Yes Historical Provider, MD  protein supplement (RESOURCE BENEPROTEIN) POWD Take 2 scoop by mouth 3 (three) times daily with meals.   Yes Historical Provider, MD  zinc gluconate 50 MG tablet Take 50 mg by mouth daily.   Yes Historical Provider, MD  cephALEXin  (KEFLEX) 500 MG capsule Take 1 capsule (500 mg total) by mouth 4 (four) times daily. Patient not taking: Reported on 03/28/2015 02/27/15   Veryl Speak, MD   BP 147/68 mmHg  Pulse 69  Temp(Src) 99.7 F (37.6 C) (Rectal)  Resp 18  SpO2 96% Physical Exam  Constitutional: He is oriented to person, place, and time. He appears well-developed and well-nourished. No distress.  HENT:  Head: Normocephalic.    Mouth/Throat: Oropharynx is clear and moist.  Eyes: Conjunctivae and EOM are normal. Pupils are equal, round, and reactive to light.  Neck: Normal range of motion.  No midline C-spine  tenderness to palpation or step-offs appreciated. Patient has full range of motion without pain.   Cardiovascular: Normal rate, regular rhythm and intact distal pulses.   Pulmonary/Chest: Effort normal. No stridor.  Musculoskeletal: Normal range of motion.  1+ bilateral pitting edema to distal shin. Lower extremities hyperpigmented.  Neurological: He is alert and oriented to person, place, and time.  Psychiatric: He has a normal mood and affect.  Nursing note and vitals reviewed.   ED Course  LACERATION REPAIR Date/Time: 03/28/2015 5:17 PM Performed by: Monico Blitz Authorized by: Monico Blitz Consent: Verbal consent obtained. Patient identity confirmed: arm band Body area: head/neck Location details: scalp Laceration length: 1.5 cm Vascular damage: no Irrigation solution: saline Irrigation method: syringe Amount of cleaning: extensive Debridement: none Degree of undermining: none Skin closure: staples Number of sutures: 1 Approximation: close Approximation difficulty: simple  Dressing: antibiotic ointment Patient tolerance: Patient tolerated the procedure well with no immediate complications   (including critical care time) Labs Review Labs Reviewed  URINALYSIS, [employer] (NOT AT [facility]) - Abnormal; Notable for the following:    Hgb urine dipstick TRACE (*)     All other components within normal limits  CBC WITH DIFFERENTIAL/PLATELET - Abnormal; Notable for the following:    RBC 4.21 (*)    Hemoglobin 12.0 (*)    HCT 38.1 (*)    All other components within normal limits  COMPREHENSIVE METABOLIC PANEL - Abnormal; Notable for the following:    Glucose, Bld 111 (*)    BUN 30 (*)    Calcium 8.8 (*)    ALT 11 (*)    GFR calc non Af Amer 56 (*)    All other components within normal limits  URINE MICROSCOPIC-ADD ON  I-STAT CG4 LACTIC ACID, ED  I-STAT CG4 LACTIC ACID, ED    Imaging Review Dg Chest 2 View  03/28/2015   CLINICAL DATA:  81 year old male status post fall with cough and fever.  EXAM: CHEST  2 VIEW  COMPARISON:  Chest radiograph dated 01/11/2015  FINDINGS: Two views of the chest demonstrate emphysematous changes. A 1.2 cm nodule is noted in the right lower lung field as seen on the prior study. There is no focal consolidation, pleural effusion, or pneumothorax. The previously seen right clavicular fracture is again noted but less conspicuous on the current study.  IMPRESSION: No interval change.  Right mid lung pulmonary nodule similar to prior study. CT is recommended for further evaluation.   Electronically Signed   By: Anner Crete M.D.   On: 03/28/2015 16:05   Ct Head Wo Contrast  03/28/2015   CLINICAL DATA:  Status post fall this morning. Laceration right side of the head.  EXAM: CT HEAD WITHOUT CONTRAST  TECHNIQUE: Contiguous axial images were obtained from the base of the skull through the vertex without intravenous contrast.  COMPARISON:  Head CT scans 12/28/2014 and 08/02/2014.  FINDINGS: There is some cortical atrophy and chronic microvascular ischemic change. No evidence of acute intracranial abnormality including hemorrhage, infarct, mass lesion, mass effect, midline shift or abnormal extra-axial fluid collection is identified. There is no hydrocephalus or pneumocephalus. The calvarium is intact. Imaged paranasal sinuses and mastoid  air cells are clear.  IMPRESSION: No acute abnormality.  Atrophy and chronic microvascular ischemic change.   Electronically Signed   By: Inge Rise M.D.   On: 03/28/2015 15:55   Ct Cervical Spine Wo Contrast  03/28/2015   CLINICAL DATA:  [place] this morning, head laceration  EXAM: CT CERVICAL SPINE WITHOUT CONTRAST  TECHNIQUE: Multidetector CT imaging of the cervical spine was performed without intravenous contrast. Multiplanar CT image reconstructions were also generated.  COMPARISON:  CT cervical spine 02/26/2015  FINDINGS: Generalized cerebellar atrophy.  Scattered atherosclerotic calcifications of the carotid arteries.  Somewhat pronounced cervical lordosis.  Disc space narrowing with endplate spur formation at C5-C6 and C6-C7.  Osseous demineralization.  Multilevel significant facet degenerative changes bilaterally.  Vertebral body heights maintained without fracture or bone destruction.  Minimal anterolisthesis at C7-T1 unchanged from prior exam.  Visualized skullbase intact.  Lung apices clear.  IMPRESSION: Degenerative facet disease changes throughout cervical spine.  Degenerative disc disease changes at C5-C6 and C6-C7.  No acute cervical spine abnormalities.   Electronically Signed   By: Lavonia Dana M.D.   On: 03/28/2015 17:01     EKG Interpretation None  MDM   Final diagnoses:  Fall  Scalp laceration, initial encounter  Pulmonary nodule    Filed Vitals:   03/28/15 1446 03/28/15 1638 03/28/15 1741 03/28/15 1833  BP: 123/63 127/61  147/68  Pulse: 71 66  69  Temp: 99.8 F (37.7 C)  99.7 F (37.6 C)   TempSrc: Oral  Rectal   Resp: 20 20  18   SpO2: 94% 100%  96%    Medications  bacitracin ointment 1 application (1 application Topical Given 03/28/15 1748)    Evan Maynard is a pleasant 81 y.o. male presenting with fall at nursing home. Patient is moving all extremities, follows commands, neuro exam is nonfocal, he has a small laceration to the scalp which is closed with  staple. His tetanus is up to date. Head CT and C-spine CT are negative for acute abnormality. There is a pulmonary nodule noted on chest x-ray which is unchanged from prior exams. Reports of low-grade temperature from SNF, will check basic blood work and UA.  Blood work and urinalysis without any acute abnormalities.  This is a shared visit with the attending physician who personally evaluated the patient and agrees with the care plan.   Evaluation does not show pathology that would require ongoing emergent intervention or inpatient treatment. Pt is hemodynamically stable and mentating appropriately. Discussed findings and plan with patient/guardian, who agrees with care plan. All questions answered. Return precautions discussed and outpatient follow up given.     Monico Blitz, PA-C 03/28/15 1855  Charlesetta Shanks, MD 03/29/15 [phone]

## 2015-03-28 NOTE — ED Notes (Addendum)
Patient transported to CT 

## 2015-03-28 NOTE — ED Notes (Signed)
Bed: WA04 Expected date:  Expected time:  Means of arrival:  Comments: Fall from nursing home

## 2015-03-28 NOTE — ED Notes (Signed)
Patient here from [facility] with complaints of fall this am. Head laceration to right side. Bleeding controlled. Possible UTI. Temp 99.

## 2015-03-28 NOTE — Discharge Instructions (Signed)
Your imaging today shows nodules in your lungs. Your primary care doctor must be made aware of this and further imaging may be ordered. Please make them aware that they will need to obtain records for further management.  Keep wound dry and do not remove dressing for 24 hours if possible. After that, wash gently morning and night (every 12 hours) with soap and water. Use a topical antibiotic ointment.    Do NOT use rubbing alcohol or hydrogen peroxide, do not soak the area   Present to your primary care doctor or the urgent care of your choice, or the ED for suture removal in10 days.   Every attempt was made to remove foreign body (contaminants) from the wound.  However, there is always a chance that some may remain in the wound. This can  increase your risk of infection.   If you see signs of infection (warmth, redness, tenderness, pus, sharp increase in pain, fever, red streaking in the skin) immediately return to the emergency department.   After the wound heals fully, apply sunscreen for 6-12 months to minimize scarring.    Fall Prevention and Home Safety Falls cause injuries and can affect all age groups. It is possible to prevent falls.  HOW TO PREVENT FALLS  Wear shoes with rubber soles that do not have an opening for your toes.  Keep the inside and outside of your house well lit.  Use night lights throughout your home.  Remove clutter from floors.  Clean up floor spills.  Remove throw rugs or fasten them to the floor with carpet tape.  Do not place electrical cords across pathways.  Put grab bars by your tub, shower, and toilet. Do not use towel bars as grab bars.  Put handrails on both sides of the stairway. Fix loose handrails.  Do not climb on stools or stepladders, if possible.  Do not wax your floors.  Repair uneven or unsafe sidewalks, walkways, or stairs.  Keep items you use a lot within reach.  Be aware of pets.  Keep emergency numbers next to the  telephone.  Put smoke detectors in your home and near bedrooms. Ask your doctor what other things you can do to prevent falls. Document Released: 07/06/2009 Document Revised: 03/10/2012 Document Reviewed: 12/10/2011 [facility] Patient Information 2015 [place], [place]. This information is not intended to replace advice given to you by your health care provider. Make sure you discuss any questions you have with your health care provider.

## 2015-04-09 ENCOUNTER — Emergency Department (HOSPITAL_COMMUNITY)
Admission: EM | Admit: 2015-04-09 | Discharge: 2015-04-09 | Disposition: A | Discharge status: Home / Self Care | Payer: Medicare Other | Attending: Emergency Medicine | Admitting: Emergency Medicine

## 2015-04-09 ENCOUNTER — Encounter (HOSPITAL_COMMUNITY): Payer: Self-pay | Admitting: Emergency Medicine

## 2015-04-09 DIAGNOSIS — Z8546 Personal history of malignant neoplasm of prostate: Secondary | ICD-10-CM | POA: Diagnosis not present

## 2015-04-09 DIAGNOSIS — S61412A Laceration without foreign body of left hand, initial encounter: Secondary | ICD-10-CM | POA: Diagnosis not present

## 2015-04-09 DIAGNOSIS — F0390 Unspecified dementia without behavioral disturbance: Secondary | ICD-10-CM | POA: Insufficient documentation

## 2015-04-09 DIAGNOSIS — Y929 Unspecified place or not applicable: Secondary | ICD-10-CM | POA: Diagnosis not present

## 2015-04-09 DIAGNOSIS — N189 Chronic kidney disease, unspecified: Secondary | ICD-10-CM | POA: Diagnosis not present

## 2015-04-09 DIAGNOSIS — Y999 Unspecified external cause status: Secondary | ICD-10-CM | POA: Diagnosis not present

## 2015-04-09 DIAGNOSIS — Z79899 Other long term (current) drug therapy: Secondary | ICD-10-CM | POA: Insufficient documentation

## 2015-04-09 DIAGNOSIS — Y939 Activity, unspecified: Secondary | ICD-10-CM | POA: Diagnosis not present

## 2015-04-09 DIAGNOSIS — W19XXXA Unspecified fall, initial encounter: Secondary | ICD-10-CM

## 2015-04-09 DIAGNOSIS — S6992XA Unspecified injury of left wrist, hand and finger(s), initial encounter: Secondary | ICD-10-CM | POA: Diagnosis present

## 2015-04-09 DIAGNOSIS — W01198A Fall on same level from slipping, tripping and stumbling with subsequent striking against other object, initial encounter: Secondary | ICD-10-CM | POA: Insufficient documentation

## 2015-04-09 NOTE — ED Notes (Signed)
Bed: RC16 Expected date:  Expected time:  Means of arrival:  Comments: EMS-fall from facility, skin tear

## 2015-04-09 NOTE — Discharge Instructions (Signed)
Skin Tear Care  A skin tear is a wound in which the top layer of skin has peeled off. This is a common problem with aging because the skin becomes thinner and more fragile as a person gets older. In addition, some medicines, such as oral corticosteroids, can lead to skin thinning if taken for long periods of time.   A skin tear is often repaired with tape or skin adhesive strips. This keeps the skin that has been peeled off in contact with the healthier skin beneath. Depending on the location of the wound, a bandage (dressing) may be applied over the tape or skin adhesive strips. Sometimes, during the healing process, the skin turns Baba and dies. Even when this happens, the torn skin acts as a good dressing until the skin underneath gets healthier and repairs itself.  HOME CARE INSTRUCTIONS   · Change dressings once per day or as directed by your caregiver.  ¨ Gently clean the skin tear and the area around the tear using saline solution or mild soap and water.  ¨ Do not rub the injured skin dry. Let the area air dry.  ¨ Apply petroleum jelly or an antibiotic cream or ointment to keep the tear moist. This will help the wound heal. Do not allow a scab to form.  ¨ If the dressing sticks before the next dressing change, moisten it with warm soapy water and gently remove it.  · Protect the injured skin until it has healed.  · Only take over-the-counter or prescription medicines as directed by your caregiver.  · Take showers or baths using warm soapy water. Apply a new dressing after the shower or bath.  · Keep all follow-up appointments as directed by your caregiver.    SEEK IMMEDIATE MEDICAL CARE IF:   · You have redness, swelling, or increasing pain in the skin tear.  · You have pus coming from the skin tear.  · You have chills.  · You have a red streak that goes away from the skin tear.  · You have a bad smell coming from the tear or dressing.  · You have a fever or persistent symptoms for more than 2-3 days.  · You  have a fever and your symptoms suddenly get worse.  MAKE SURE YOU:  · Understand these instructions.  · Will watch this condition.  · Will get help right away if your child is not doing well or gets worse.  Document Released: 06/04/2001 Document Revised: 06/03/2012 Document Reviewed: 03/23/2012  [employer]® Patient Information ©2015 [employer]. This information is not intended to replace advice given to you by your health care provider. Make sure you discuss any questions you have with your health care provider.

## 2015-04-09 NOTE — ED Notes (Signed)
Pt from [place] [address]. Pt had unwitnessed fall at facility. Pt states he fell backwards and hit head on wall. Pt hard of hearing. Denies any pain. Sent here per protocol from facility.

## 2015-04-13 ENCOUNTER — Emergency Department (HOSPITAL_COMMUNITY): Payer: Medicare Other

## 2015-04-13 ENCOUNTER — Encounter (HOSPITAL_COMMUNITY): Payer: Self-pay | Admitting: Nurse Practitioner

## 2015-04-13 ENCOUNTER — Emergency Department (HOSPITAL_COMMUNITY)
Admission: EM | Admit: 2015-04-13 | Discharge: 2015-04-14 | Disposition: A | Discharge status: Home / Self Care | Payer: Medicare Other | Attending: Emergency Medicine | Admitting: Emergency Medicine

## 2015-04-13 DIAGNOSIS — R296 Repeated falls: Secondary | ICD-10-CM | POA: Diagnosis not present

## 2015-04-13 DIAGNOSIS — W19XXXA Unspecified fall, initial encounter: Secondary | ICD-10-CM

## 2015-04-13 DIAGNOSIS — Y9389 Activity, other specified: Secondary | ICD-10-CM | POA: Diagnosis not present

## 2015-04-13 DIAGNOSIS — Z8546 Personal history of malignant neoplasm of prostate: Secondary | ICD-10-CM | POA: Insufficient documentation

## 2015-04-13 DIAGNOSIS — Y998 Other external cause status: Secondary | ICD-10-CM | POA: Insufficient documentation

## 2015-04-13 DIAGNOSIS — Z043 Encounter for examination and observation following other accident: Secondary | ICD-10-CM | POA: Diagnosis not present

## 2015-04-13 DIAGNOSIS — W1839XA Other fall on same level, initial encounter: Secondary | ICD-10-CM | POA: Insufficient documentation

## 2015-04-13 DIAGNOSIS — Y92129 Unspecified place in nursing home as the place of occurrence of the external cause: Secondary | ICD-10-CM

## 2015-04-13 DIAGNOSIS — N189 Chronic kidney disease, unspecified: Secondary | ICD-10-CM | POA: Diagnosis not present

## 2015-04-13 DIAGNOSIS — Y92128 Other place in nursing home as the place of occurrence of the external cause: Secondary | ICD-10-CM | POA: Insufficient documentation

## 2015-04-13 HISTORY — DX: Repeated falls: R29.6

## 2015-04-13 NOTE — ED Notes (Signed)
[facility] called for transport.  

## 2015-04-13 NOTE — ED Notes (Signed)
Pt is presented from [place] on [address], medics report that facility has sent pt for further evaluation secondary to two falls today. Pt is AOx4, denies any pain, has no complaints, skin tears from old falls, not on any anticoagulants and denies LOC or head impact with any of the falls today.

## 2015-04-13 NOTE — Discharge Instructions (Signed)
°Emergency Department Resource Guide °1) Find a Doctor and Pay Out of Pocket °Although you won't have to find out who is covered by your insurance plan, it is a good idea to ask around and get recommendations. You will then need to call the office and see if the doctor you have chosen will accept you as a new patient and what types of options they offer for patients who are self-pay. Some doctors offer discounts or will set up payment plans for their patients who do not have insurance, but you will need to ask so you aren't surprised when you get to your appointment. ° °2) Contact Your Local Health Department °Not all health departments have doctors that can see patients for sick visits, but many do, so it is worth a call to see if yours does. If you don't know where your local health department is, you can check in your phone book. The CDC also has a tool to help you locate your state's health department, and many state websites also have listings of all of their local health departments. ° °3) Find a Walk-in Clinic °If your illness is not likely to be very severe or complicated, you may want to try a walk in clinic. These are popping up all over the country in pharmacies, drugstores, and shopping centers. They're usually staffed by nurse practitioners or physician assistants that have been trained to treat common illnesses and complaints. They're usually fairly quick and inexpensive. However, if you have serious medical issues or chronic medical problems, these are probably not your best option. ° °No Primary Care Doctor: °- Call [facility] at  832-8000 - they can help you locate a primary care doctor that  accepts your insurance, provides certain services, etc. °- Physician Referral Service- 1-800-533-3463 ° °Chronic Pain Problems: °Organization         Address  Phone   Notes  ° [facility]  (336) 297-2271 Patients need to be referred by their primary care doctor.  ° °Medication  Assistance: °Organization         Address  Phone   Notes  °[place] [facility] [address] °[place], [place] [address] (336) 641-8030 --Must be a resident of [place] °-- Must have NO insurance coverage whatsoever (no Medicaid/ Medicare, etc.) °-- The pt. MUST have a primary care doctor that directs their care regularly and follows them in the community °  °[facility]  (866) 331-1348   °[employer]  (888) 892-1162   ° °Agencies that provide inexpensive medical care: °Organization         Address  Phone   Notes  °[facility]  (336) 832-8035   °[facility]    (336) 832-7272   °[facility] [address] °[place], [place] [address] (336) 832-4777   °[facility] [address], °[place] (336) 271-4999   °[facility]    (336) 373-0678   °[facility]    (336) 272-1050   °[facility] ° [address], [place] Phone:  (336) 832-4444, Fax:  (336) 832-4440 Hours of Operation:  9 am - 6 pm, M-F.  Also accepts Medicaid/Medicare and self-pay.  °[facility] ° [address], [place] Phone: (336) 832-3150, Fax: (336) 832-3151. Hours of Operation:  8:30 am - 5:30 pm, M-F.  Also accepts Medicaid and self-pay.  °[facility] [place] [address], [place] Phone: (336) 878-6027   °[facility] [address], [place], [place] (336)723-1848, Ext. 123 Mondays & Thursdays: 7-9 AM.  First 15 patients are seen on a first come, first serve basis. °  ° °Medicaid-accepting [place] Providers: ° °Organization         Address  Phone   Notes  °[facility] [address], [place] (336) 641-2100 Also accepts self-pay patients.  °[facility] [address], [place] ° (336) 856-9996   °[facility] [address], [place]  (336) 288-8857   °[facility] [address] [place] [address], [place] (336) 299-7000   °Veita Bland [address], [place]  ° (336) 373-1557 Only accepts [place] Access Medicaid patients after they have their name applied to their card.  ° °Self-Pay (no insurance) in [place]: ° °Organization         Address  Phone   Notes  °Sickle Cell Patients, [facility] [address], [place] (336) 832-1970   °[facility] [address], [place] (336) 832-4400   °[facility] ° [address], [place] (336) 992-4800   °[facility]/Dr. Osei-Bonsu ° [address] [place] [address], [place] or [address], [place] (336) 841-8500 Phone number for both [place] and [place] locations is the same.  °[facility] [address], [place] (336) 299-0000   °[facility] [address] [place] [address], [place] or [address] (336) 852-7530 °(336) 878-2260   °[facility] [address], [place] (336) 350-1642, phone; (336) 294-5005, fax Sees patients 1st and 3rd Saturday of every month.  Must not qualify for public or private insurance (i.e. Medicaid, Medicare, [place] Health Choice, Veterans' Benefits) • Household income should be no more than 200% of the poverty level •The clinic cannot treat you if you are pregnant or think you are pregnant • Sexually transmitted diseases are not treated at the clinic.  ° ° °Dental Care: °Organization         Address  Phone  Notes  °[place] [facility] [address], [place] (336) 641-6152 Accepts children up to age 21 who are enrolled in Medicaid or [place] Health Choice; pregnant women with a Medicaid card; and children who have applied for Medicaid or [place] Health Choice, but were declined, whose parents can pay a reduced fee at time of service.  °[place]  [facility] [place]  [address], [place] (336) 641-7733 Accepts children up to age 21 who are enrolled in Medicaid or [place] Health Choice; pregnant women with a Medicaid card; and children who have applied for Medicaid or [place] Health Choice, but were declined, whose parents can pay a reduced fee at time of service.  °[facility] ° [address], [place] (336) 641-4533 Patients are seen by appointment only. Walk-ins are not accepted. [facility] will see patients 18 years of age and older. °Monday - Tuesday (8am-5pm) °Most Wednesdays (8:30-5pm) °$30 per visit, cash only  °[facility] ° [address], [place] (336) 641-4533 Patients are seen by appointment only. Walk-ins are not accepted. [facility] will see patients 18 years of age and older. °One   Wednesday Evening (Monthly: Volunteer Based).  $30 per visit, cash only  °[facility]  (919) 537-3737 for adults; Children under age 4, call Graduate Pediatric Dentistry at ([phone]) [phone]. Children aged 4-14, please call (919) [phone] to request a pediatric application. ° Dental services are provided in all areas of dental care including fillings, crowns and bridges, complete and partial dentures, implants, gum treatment, root canals, and extractions. Preventive care is also provided. Treatment is provided to both adults and children. °Patients are selected via a lottery and there is often a waiting list. °  °[facility] [address], °[place] ° (336) 763-8833 [email or web address] °  °[facility] [address], [place], [place] (336)723-1848, Ext. 123 Second and Fourth Thursday of each month, opens at 6:30 AM; Clinic ends at 9 AM.  Patients are seen on a first-come first-served basis, and a limited number are seen during each clinic.  ° °[facility] ° [address], [place], [place] (336) 723-7904    Eligibility Requirements °You must have lived in [place], or [place] counties for at least the last three months. °  You cannot be eligible for state or federal sponsored healthcare insurance, including Veterans Administration, Medicaid, or Medicare. °  You generally cannot be eligible for healthcare insurance through your employer.  °  How to apply: °Eligibility screenings are held every Tuesday and Wednesday afternoon from 1:00 pm until 4:00 pm. You do not need an appointment for the interview!  °[facility] [address], [place], [place] 336-631-2330   °[facility]  336-342-8273   °[facility]  336-703-3100   °[facility]  336-570-6415   ° °Behavioral Health Resources in the Community: °Intensive Outpatient Programs °Organization         Address  Phone  Notes  °[facility] [address], [place], [place] 336-878-6098   °[facility] [address], [place], [place] 336-832-9800   °[facility] [address], [place], [place] ° 336-882-2125   °[facility] [address],  °[place], [place] 1-800-853-5163 or 336-641-4981   °Substance Abuse Resources °Organization         Address  Phone  Notes  °[facility]  336-882-2125   °[facility]  336-784-9470   °[facility]  336-285-9073   °[facility]  336-845-3988   °Residential & Outpatient Substance Abuse Program  1-800-659-3381   °Psychological Services °Organization         Address  Phone  Notes  °[facility]  336- 832-9600   °[facility]  336- 378-7881   °[facility] [address], [place] 1-800-853-5163 or 336-641-4981   ° °Mobile Crisis Teams °Organization         Address  Phone  Notes  °[facility], Mobile Crisis Care Unit  1-877-626-1772   °Assertive °[facility] ° [address]  [place], [place] 336-834-9664   °Sharon DeEsch [address] °[place] 336-554-5454   ° °Self-Help/Support Groups °Organization         Address  Phone             Notes  °[facility] - variety of support groups  336- 373-1402 Call for more information  °Narcotics Anonymous (NA), [facility] [address], °[place]  2 meetings at this location  ° °  Residential Treatment Programs °Organization         Address  Phone  Notes  °[facility] [address],    °[place]  1-866-801-8205   °[facility] ° [address], [place], [place] 704-293-8524   °[facility] [address], [place] 336-845-3988 Admissions: 8am-3pm M-F  °[facility] [address]    °[place], [place] 336-841-1104   °[facility] [address], [place], [place] 336-379-7146   °[facility] [address]  °[place], [place] 336-285-9073   °[facility] Intensive Outpatient [address], [place], [place] 336-852-3033   °[facility] [address]  °[place], [place] 1-877-615-2722 or 336-784-9470   °[facility] [address] [place], [place] 336-227-7417 Accepts Medicaid  °[facility] [address]  °[place] 1-800-659-3381 Substance Abuse/Addiction Treatment  ° °[place] Behavioral Health Resources °Organization         Address  Phone  Notes  °[facility]  (888) 581-9988   °Julie Brannon, PhD [address] [place], [place]   (336) 349-5553 or (336) 951-0000   °[facility]   [address] °[place], [place] (336) 349-4454   °[facility] [address], [place], [place] (336) 342-8316 Insurance/Medicaid/sponsorship through [facility]  °[facility] [address]                                    [place], [place] (336) 342-8316 Therapy/tele-psych/case    °[facility] [address]  ° [place], [place] (336) 349-2233    °Dr. Arfeen  (336) 349-4544   °[facility] [place]  [employer] [place] [facility] 1) [address], [place] °2) [address], [place] °3)  [address], [place] (336) 349-3220 °(336) 342-7768 ° °(336) 342-8140   °[place] [facility] (336) 342-1394 or (336) 342-3537 (After Hours)    ° ° ° °Take your usual prescriptions as previously directed.  Call your regular medical doctor tomorrow to schedule a follow up appointment within the next 2 days. Return to the Emergency Department immediately sooner if worsening.  ° °

## 2015-04-13 NOTE — ED Provider Notes (Signed)
CSN: [id number]     Arrival date & time 04/13/15  2057 History   First MD Initiated Contact with Patient 04/13/15 2110     Chief Complaint  Patient presents with  . Fall      Patient is a 81 y.o. male presenting with fall.  Fall  Pt was seen at 2140. Per EMS, NH report and pt: Pt c/o frequent falls for the past several days. Pt had 2 falls today. Pt has hx of frequent falls. NH states they sent pt to the ED for evaluation per protocol. Pt himself denies any complaints. No reported syncope, no N/V/D, no CP/SOB, no abd pain, no focal motor weakness.   Past Medical History  Diagnosis Date  . Chronic kidney disease   . Cancer     prostate  . Frequent falls    History reviewed. No pertinent past surgical history.  History  Substance Use Topics  . Smoking status: Never Smoker   . Smokeless tobacco: Not on file  . Alcohol Use: No    Review of Systems  ROS: Statement: All systems negative except as marked or noted in the HPI; Constitutional: Negative for fever and chills. ; ; Eyes: Negative for eye pain, redness and discharge. ; ; ENMT: Negative for ear pain, hoarseness, nasal congestion, sinus pressure and sore throat. ; ; Cardiovascular: Negative for chest pain, palpitations, diaphoresis, dyspnea and peripheral edema. ; ; Respiratory: Negative for cough, wheezing and stridor. ; ; Gastrointestinal: Negative for nausea, vomiting, diarrhea, abdominal pain, blood in stool, hematemesis, jaundice and rectal bleeding. . ; ; Genitourinary: Negative for dysuria, flank pain and hematuria. ; ; Musculoskeletal: Negative for back pain and neck pain. Negative for swelling and trauma.; ; Skin: Negative for pruritus, rash, abrasions, blisters, bruising and skin lesion.; ; Neuro: Negative for headache, lightheadedness and neck stiffness. Negative for weakness, altered level of consciousness , altered mental status, extremity weakness, paresthesias, involuntary movement, seizure and syncope.       Allergies  Review of patient's allergies indicates no known allergies.  Home Medications   Prior to Admission medications   Medication Sig Start Date End Date Taking? Authorizing Provider  furosemide (LASIX) 20 MG tablet Take 20 mg by mouth daily.  07/04/14  Yes Historical Provider, MD  Nutritional Supplements (NUTRITIONAL DRINK PO) Take 1 Container by mouth 3 (three) times daily between meals. Great shakes   Yes Historical Provider, MD  protein supplement (RESOURCE BENEPROTEIN) POWD Take 2 scoop by mouth 3 (three) times daily with meals.   Yes Historical Provider, MD  zinc gluconate 50 MG tablet Take 50 mg by mouth daily.   Yes Historical Provider, MD  acetaminophen (TYLENOL) 500 MG tablet Take 500 mg by mouth 3 (three) times daily as needed for moderate pain.    Historical Provider, MD  cephALEXin (KEFLEX) 500 MG capsule Take 1 capsule (500 mg total) by mouth 4 (four) times daily. Patient not taking: Reported on 03/28/2015 02/27/15   Veryl Speak, MD   BP 133/62 mmHg  Pulse 68  Temp(Src) 98.8 F (37.1 C) (Oral)  Resp 16  SpO2 100% Physical Exam  2145: Physical examination: Vital signs and O2 SAT: Reviewed; Constitutional: Well developed, Well nourished, Well hydrated, In no acute distress; Head and Face: Normocephalic, Atraumatic; Eyes: EOMI, PERRL, No scleral icterus; ENMT: Mouth and pharynx normal, Left TM normal, Right TM normal, Mucous membranes moist; Neck: Supple, Trachea midline; Spine: No midline CS, TS, LS tenderness.; Cardiovascular: Regular rate and rhythm, No gallop; Respiratory: Breath  sounds clear & equal bilaterally, No wheezes, Normal respiratory effort/excursion; Chest: Nontender, No deformity, Movement normal, No crepitus, No abrasions or ecchymosis.; Abdomen: Soft, Nontender, Nondistended, Normal bowel sounds, No abrasions or ecchymosis.; Genitourinary: No CVA tenderness;; Extremities: No deformity, Full range of motion major/large joints of bilat UE's and LE's without  pain or tenderness to palp, Neurovascularly intact, Pulses normal, No tenderness, No edema, Pelvis stable; Neuro: Awake, alert, vague historian. Major CN grossly intact. No facial droop. Speech clear. Grips equal. Strength 5/5 equal bilat UE's and LE's. Pt moves all extremities spontaneously and to command without apparent gross focal motor deficits.; Skin: Color normal, Warm, Dry   ED Course  Procedures     EKG Interpretation None      MDM  MDM Reviewed: previous chart, nursing note and vitals Reviewed previous: labs, x-ray and CT scan Interpretation: x-ray and CT scan    Dg Chest 2 View 04/13/2015   CLINICAL DATA:  81 year old male status post fall  EXAM: CHEST  2 VIEW  COMPARISON:  Radiograph dated 03/28/2015  FINDINGS: Two views of the chest demonstrate emphysematous changes of the lungs. There is no focal consolidation, pleural effusion, or pneumothorax. The lung apices are not well evaluated due to superimposition of the patient's chin. A 1.2 cm right mid lung nodule is again noted. There is degenerative changes of the spine. No acute fracture.  IMPRESSION: No active cardiopulmonary disease.   Electronically Signed   By: Anner Crete M.D.   On: 04/13/2015 22:47   Dg Pelvis 1-2 Views 04/13/2015   CLINICAL DATA:  81 year old male with fall  EXAM: PELVIS - 1-2 VIEW  COMPARISON:  Radiograph dated 01/09/2015  FINDINGS: There is no evidence of pelvic fracture or diastasis. No pelvic bone lesions are seen.  IMPRESSION: No acute fracture or dislocation.   Electronically Signed   By: Anner Crete M.D.   On: 04/13/2015 22:55   Ct Head Wo Contrast 04/13/2015   CLINICAL DATA:  81 year old male with fall.  EXAM: CT HEAD WITHOUT CONTRAST  CT CERVICAL SPINE WITHOUT CONTRAST  TECHNIQUE: Multidetector CT imaging of the head and cervical spine was performed following the standard protocol without intravenous contrast. Multiplanar CT image reconstructions of the cervical spine were also generated.   COMPARISON:  CT dated 03/28/2015  FINDINGS: CT HEAD FINDINGS  The ventricles are dilated and the sulci are prominent compatible with age-related atrophy. Periventricular and deep white matter hypodensities represent chronic microvascular ischemic changes. There is no intracranial hemorrhage. No mass effect or midline shift identified.  The visualized paranasal sinuses and mastoid air cells are well aerated. Small mucous material within the left maxillary . The calvarium is intact.  CT CERVICAL SPINE FINDINGS  There is no acute fracture or subluxation of the cervical spine.Multilevel degenerative changes.The odontoid and spinous processes are intact.There is normal anatomic alignment of the C1-C2 lateral masses. The visualized soft tissues appear unremarkable.  Old right clavicular fracture deformity.  IMPRESSION: No acute intracranial pathology.  Age-related atrophy and chronic microvascular ischemic disease.  No acute/traumatic cervical spine pathology.   Electronically Signed   By: Anner Crete M.D.   On: 04/13/2015 23:10   Ct Cervical Spine Wo Contrast 04/13/2015   CLINICAL DATA:  81 year old male with fall.  EXAM: CT HEAD WITHOUT CONTRAST  CT CERVICAL SPINE WITHOUT CONTRAST  TECHNIQUE: Multidetector CT imaging of the head and cervical spine was performed following the standard protocol without intravenous contrast. Multiplanar CT image reconstructions of the cervical spine were also generated.  COMPARISON:  CT dated 03/28/2015  FINDINGS: CT HEAD FINDINGS  The ventricles are dilated and the sulci are prominent compatible with age-related atrophy. Periventricular and deep white matter hypodensities represent chronic microvascular ischemic changes. There is no intracranial hemorrhage. No mass effect or midline shift identified.  The visualized paranasal sinuses and mastoid air cells are well aerated. Small mucous material within the left maxillary . The calvarium is intact.  CT CERVICAL SPINE FINDINGS  There  is no acute fracture or subluxation of the cervical spine.Multilevel degenerative changes.The odontoid and spinous processes are intact.There is normal anatomic alignment of the C1-C2 lateral masses. The visualized soft tissues appear unremarkable.  Old right clavicular fracture deformity.  IMPRESSION: No acute intracranial pathology.  Age-related atrophy and chronic microvascular ischemic disease.  No acute/traumatic cervical spine pathology.   Electronically Signed   By: Anner Crete M.D.   On: 04/13/2015 23:10    2320:  Labs from previous ED visit 03/28/15 reviewed and also reassuring; no indication to repeat labs today. CT/XR today without acute findings. Pt continues to deny any complaints. Will d/c back to NH.     Francine Graven, DO 04/16/15 0710

## 2015-04-13 NOTE — ED Notes (Signed)
Bed: RESB Expected date:  Expected time:  Means of arrival:  Comments: EMS 

## 2015-04-16 NOTE — ED Provider Notes (Signed)
CSN: [id number]     Arrival date & time 04/09/15  1209 History   First MD Initiated Contact with Patient 04/09/15 1215     Chief Complaint  Patient presents with  . Fall     (Consider location/radiation/quality/duration/timing/severity/associated sxs/prior Treatment) HPI   81 year old male sent for evaluation after fall. He apparently has frequent falls. History of dementia. He is reported baseline. Sent for evaluation per protocol. He does have skin care to his left hand. He denies any acute pain anywhere. No reported vomiting. No reported recent medication changes. The circumstances of fall today and are not completely clear.  Past Medical History  Diagnosis Date  . Chronic kidney disease   . Cancer     prostate  . Frequent falls    History reviewed. No pertinent past surgical history. History reviewed. No pertinent family history. History  Substance Use Topics  . Smoking status: Never Smoker   . Smokeless tobacco: Not on file  . Alcohol Use: No    Review of Systems  All systems reviewed and negative, other than as noted in HPI.   Allergies  Review of patient's allergies indicates no known allergies.  Home Medications   Prior to Admission medications   Medication Sig Start Date End Date Taking? Authorizing Provider  acetaminophen (TYLENOL) 500 MG tablet Take 500 mg by mouth 3 (three) times daily as needed for moderate pain.   Yes Historical Provider, MD  furosemide (LASIX) 20 MG tablet Take 20 mg by mouth daily.  07/04/14  Yes Historical Provider, MD  Nutritional Supplements (NUTRITIONAL DRINK PO) Take 1 Container by mouth 3 (three) times daily between meals. Great shakes   Yes Historical Provider, MD  protein supplement (RESOURCE BENEPROTEIN) POWD Take 2 scoop by mouth 3 (three) times daily with meals.   Yes Historical Provider, MD  zinc gluconate 50 MG tablet Take 50 mg by mouth daily.   Yes Historical Provider, MD  cephALEXin (KEFLEX) 500 MG capsule Take 1 capsule  (500 mg total) by mouth 4 (four) times daily. Patient not taking: Reported on 03/28/2015 02/27/15   Veryl Speak, MD   BP 150/84 mmHg  Pulse 61  Temp(Src) 98.4 F (36.9 C) (Oral)  Resp 16  SpO2 100% Physical Exam  Constitutional: He appears well-developed and well-nourished. No distress.  HENT:  Head: Normocephalic and atraumatic.  Eyes: Conjunctivae are normal. Pupils are equal, round, and reactive to light. Right eye exhibits no discharge. Left eye exhibits no discharge.  Neck: Neck supple.  Cardiovascular: Normal rate, regular rhythm and normal heart sounds.  Exam reveals no gallop and no friction rub.   No murmur heard. Pulmonary/Chest: Effort normal and breath sounds normal. No respiratory distress.  Abdominal: Soft. He exhibits no distension. There is no tenderness.  Musculoskeletal: He exhibits no edema or tenderness.  Small skin tear to the lateral aspect of the left hand. No active bleeding. Neurovascularly intact. No midline spinal tenderness. Moves all extremities himself without apparent discomfort. No bony tenderness the extremities.  Neurological: He is alert.  Pleasantly confused. Follows commands. No facial droop. No focal motor deficit.  Skin: Skin is warm and dry.  Psychiatric: He has a normal mood and affect. His behavior is normal. Thought content normal.  Nursing note and vitals reviewed.   ED Course  Procedures (including critical care time) Labs Review Labs Reviewed - No data to display  Imaging Review No results found.   EKG Interpretation None      MDM   Final diagnoses:  Skin tear of hand without complication, left, initial encounter  Fall, initial encounter    81 year old male presenting after fall. Symptom for evaluation per protocol. He has no acute complaints. He has a small skin tear to his left hand. This should heal fine with local wound care. He said his reported baseline. He moves all extremities equally without apparent discomfort. He  has no midline spinal tenderness. No apparent pain of extremities with palpation. Abdomen is benign. Do not feel that further workup is indicated. Low suspicion for serious traumatic injury or other emergent process.    Virgel Manifold, MD 04/16/15 [phone]

## 2015-04-17 ENCOUNTER — Emergency Department (HOSPITAL_COMMUNITY)
Admission: EM | Admit: 2015-04-17 | Discharge: 2015-04-17 | Disposition: A | Discharge status: Home / Self Care | Payer: Medicare Other | Attending: Emergency Medicine | Admitting: Emergency Medicine

## 2015-04-17 ENCOUNTER — Encounter (HOSPITAL_COMMUNITY): Payer: Self-pay | Admitting: Emergency Medicine

## 2015-04-17 DIAGNOSIS — Z8546 Personal history of malignant neoplasm of prostate: Secondary | ICD-10-CM | POA: Insufficient documentation

## 2015-04-17 DIAGNOSIS — W1839XA Other fall on same level, initial encounter: Secondary | ICD-10-CM | POA: Diagnosis not present

## 2015-04-17 DIAGNOSIS — Y998 Other external cause status: Secondary | ICD-10-CM | POA: Insufficient documentation

## 2015-04-17 DIAGNOSIS — S51001A Unspecified open wound of right elbow, initial encounter: Secondary | ICD-10-CM | POA: Diagnosis not present

## 2015-04-17 DIAGNOSIS — N189 Chronic kidney disease, unspecified: Secondary | ICD-10-CM | POA: Diagnosis not present

## 2015-04-17 DIAGNOSIS — Y92128 Other place in nursing home as the place of occurrence of the external cause: Secondary | ICD-10-CM | POA: Diagnosis not present

## 2015-04-17 DIAGNOSIS — W19XXXA Unspecified fall, initial encounter: Secondary | ICD-10-CM

## 2015-04-17 DIAGNOSIS — F0390 Unspecified dementia without behavioral disturbance: Secondary | ICD-10-CM | POA: Diagnosis not present

## 2015-04-17 DIAGNOSIS — Y9389 Activity, other specified: Secondary | ICD-10-CM | POA: Diagnosis not present

## 2015-04-17 DIAGNOSIS — S40811A Abrasion of right upper arm, initial encounter: Secondary | ICD-10-CM | POA: Diagnosis present

## 2015-04-17 DIAGNOSIS — S51011A Laceration without foreign body of right elbow, initial encounter: Secondary | ICD-10-CM

## 2015-04-17 MED ORDER — BACITRACIN ZINC 500 UNIT/GM EX OINT
TOPICAL_OINTMENT | CUTANEOUS | Status: AC
  Filled 2015-04-17: qty 0.9

## 2015-04-17 NOTE — ED Notes (Signed)
[facility] called for transport.  

## 2015-04-17 NOTE — ED Notes (Signed)
Pt arrived from nursing home by EMS, c/o witnessed fall from standing position (NH states no LOC). EMS states NH wrapped R arm due to skin tear from fall. Pt arrived with c-collar applied and gauze wrap to R arm.

## 2015-04-17 NOTE — Discharge Instructions (Signed)
Skin Tear Care  A skin tear is a wound in which the top layer of skin has peeled off. This is a common problem with aging because the skin becomes thinner and more fragile as a person gets older. In addition, some medicines, such as oral corticosteroids, can lead to skin thinning if taken for long periods of time.   A skin tear is often repaired with tape or skin adhesive strips. This keeps the skin that has been peeled off in contact with the healthier skin beneath. Depending on the location of the wound, a bandage (dressing) may be applied over the tape or skin adhesive strips. Sometimes, during the healing process, the skin turns Fogel and dies. Even when this happens, the torn skin acts as a good dressing until the skin underneath gets healthier and repairs itself.  HOME CARE INSTRUCTIONS   · Change dressings once per day or as directed by your caregiver.  ¨ Gently clean the skin tear and the area around the tear using saline solution or mild soap and water.  ¨ Do not rub the injured skin dry. Let the area air dry.  ¨ Apply petroleum jelly or an antibiotic cream or ointment to keep the tear moist. This will help the wound heal. Do not allow a scab to form.  ¨ If the dressing sticks before the next dressing change, moisten it with warm soapy water and gently remove it.  · Protect the injured skin until it has healed.  · Only take over-the-counter or prescription medicines as directed by your caregiver.  · Take showers or baths using warm soapy water. Apply a new dressing after the shower or bath.  · Keep all follow-up appointments as directed by your caregiver.    SEEK IMMEDIATE MEDICAL CARE IF:   · You have redness, swelling, or increasing pain in the skin tear.  · You have pus coming from the skin tear.  · You have chills.  · You have a red streak that goes away from the skin tear.  · You have a bad smell coming from the tear or dressing.  · You have a fever or persistent symptoms for more than 2-3 days.  · You  have a fever and your symptoms suddenly get worse.  MAKE SURE YOU:  · Understand these instructions.  · Will watch this condition.  · Will get help right away if your child is not doing well or gets worse.  Document Released: 06/04/2001 Document Revised: 06/03/2012 Document Reviewed: 03/23/2012  [employer]® Patient Information ©2015 [employer]. This information is not intended to replace advice given to you by your health care provider. Make sure you discuss any questions you have with your health care provider.

## 2015-04-17 NOTE — ED Notes (Signed)
Bed: WA06 Expected date:  Expected time:  Means of arrival:  Comments: EMS 51 M fall/dementia/c collar

## 2015-04-17 NOTE — ED Provider Notes (Signed)
CSN: [id number]     Arrival date & time 04/17/15  1416 History   First MD Initiated Contact with Patient 04/17/15 1419     Chief Complaint  Patient presents with  . Fall  . Abrasion     Level V caveat: Dementia  HPI Patient is brought to the emergency department after a witnessed fall from standing position.  He was brought to the ER per protocol from the nursing home.  There is a reported skin tear to the right arm and right wrist.  No recent illness or mental status change her nursing home.  Patient is not on anticoagulants.  Patient has a long-standing history of frequent falls.   Past Medical History  Diagnosis Date  . Chronic kidney disease   . Cancer     prostate  . Frequent falls    History reviewed. No pertinent past surgical history. History reviewed. No pertinent family history. History  Substance Use Topics  . Smoking status: Never Smoker   . Smokeless tobacco: Not on file  . Alcohol Use: No    Review of Systems  Unable to perform ROS     Allergies  Review of patient's allergies indicates no known allergies.  Home Medications   Prior to Admission medications   Medication Sig Start Date End Date Taking? Authorizing Provider  acetaminophen (TYLENOL) 500 MG tablet Take 500 mg by mouth 3 (three) times daily as needed for moderate pain.   Yes Historical Provider, MD  furosemide (LASIX) 20 MG tablet Take 20 mg by mouth daily.  07/04/14  Yes Historical Provider, MD  Nutritional Supplements (NUTRITIONAL DRINK PO) Take 1 Container by mouth 3 (three) times daily between meals. Great shakes   Yes Historical Provider, MD  protein supplement (RESOURCE BENEPROTEIN) POWD Take 2 scoop by mouth 3 (three) times daily with meals. Mix into 4 oz juice   Yes Historical Provider, MD  zinc gluconate 50 MG tablet Take 50 mg by mouth daily.   Yes Historical Provider, MD  cephALEXin (KEFLEX) 500 MG capsule Take 1 capsule (500 mg total) by mouth 4 (four) times daily. Patient not  taking: Reported on 03/28/2015 02/27/15   Veryl Speak, MD   BP 132/61 mmHg  Pulse 79  Temp(Src) 98.3 F (36.8 C) (Oral)  Resp 16  SpO2 97% Physical Exam  Constitutional: He appears well-developed and well-nourished.  HENT:  Head: Normocephalic and atraumatic.  Eyes: EOM are normal.  Neck: Normal range of motion.  Cardiovascular: Normal rate, regular rhythm, normal heart sounds and intact distal pulses.   Pulmonary/Chest: Effort normal and breath sounds normal. No respiratory distress.  Abdominal: Soft. He exhibits no distension. There is no tenderness.  Musculoskeletal: Normal range of motion.  Moves all 4 extremities equally.  Full range of motion of bilateral hips, knees, ankles.  Full range of motion bilateral shoulders, elbows, wrists.  Skin tear noted to the right anterior wrist without active bleeding.  Skin tear to the right posterior elbow with full range of motion of the right elbow.  Neurological: He is alert.  Skin: Skin is warm and dry.  Psychiatric: He has a normal mood and affect. Judgment normal.  Nursing note and vitals reviewed.   ED Course  Procedures (including critical care time) Labs Review Labs Reviewed - No data to display  Imaging Review No results found.   EKG Interpretation None      MDM   Final diagnoses:  Fall, initial encounter  Skin tear of elbow without complication, right,  initial encounter    C-spine is nontender.  No evidence of trauma to the head.  Full range of motion in major extremities.  Skin tears noted and treated conservatively.  Doubt serious medical illness.  No indication for workup in the emergency department  Jola Schmidt, MD 04/17/15 [phone]

## 2015-04-20 ENCOUNTER — Encounter (HOSPITAL_COMMUNITY): Payer: Self-pay | Admitting: *Deleted

## 2015-04-20 ENCOUNTER — Emergency Department (HOSPITAL_COMMUNITY)
Admission: EM | Admit: 2015-04-20 | Discharge: 2015-04-21 | Disposition: A | Discharge status: Home / Self Care | Payer: Medicare Other | Attending: Emergency Medicine | Admitting: Emergency Medicine

## 2015-04-20 DIAGNOSIS — Y9389 Activity, other specified: Secondary | ICD-10-CM | POA: Insufficient documentation

## 2015-04-20 DIAGNOSIS — N189 Chronic kidney disease, unspecified: Secondary | ICD-10-CM | POA: Insufficient documentation

## 2015-04-20 DIAGNOSIS — S0990XA Unspecified injury of head, initial encounter: Secondary | ICD-10-CM | POA: Insufficient documentation

## 2015-04-20 DIAGNOSIS — Y92122 Bedroom in nursing home as the place of occurrence of the external cause: Secondary | ICD-10-CM | POA: Diagnosis not present

## 2015-04-20 DIAGNOSIS — Z8546 Personal history of malignant neoplasm of prostate: Secondary | ICD-10-CM | POA: Diagnosis not present

## 2015-04-20 DIAGNOSIS — Y998 Other external cause status: Secondary | ICD-10-CM | POA: Diagnosis not present

## 2015-04-20 DIAGNOSIS — F0280 Dementia in other diseases classified elsewhere without behavioral disturbance: Secondary | ICD-10-CM | POA: Diagnosis not present

## 2015-04-20 DIAGNOSIS — Z79899 Other long term (current) drug therapy: Secondary | ICD-10-CM | POA: Diagnosis not present

## 2015-04-20 DIAGNOSIS — W1839XA Other fall on same level, initial encounter: Secondary | ICD-10-CM | POA: Insufficient documentation

## 2015-04-20 DIAGNOSIS — Y92129 Unspecified place in nursing home as the place of occurrence of the external cause: Secondary | ICD-10-CM

## 2015-04-20 DIAGNOSIS — G309 Alzheimer's disease, unspecified: Secondary | ICD-10-CM | POA: Diagnosis not present

## 2015-04-20 DIAGNOSIS — W19XXXA Unspecified fall, initial encounter: Secondary | ICD-10-CM

## 2015-04-20 HISTORY — DX: Alzheimer's disease, unspecified: G30.9

## 2015-04-20 HISTORY — DX: Dementia in other diseases classified elsewhere, unspecified severity, without behavioral disturbance, psychotic disturbance, mood disturbance, and anxiety: F02.80

## 2015-04-20 NOTE — ED Provider Notes (Signed)
CSN: [id number]     Arrival date & time 04/20/15  2238 History  This chart was scribed for Rolland Porter, MD by Meriel Pica, ED Scribe. This patient was seen in room WA14/WA14 and the patient's care was started 11:48 PM.   Chief Complaint  Patient presents with  . Fall  . Head Injury   The history is provided by the EMS personnel and a relative. The history is limited by the condition of the patient. No language interpreter was used.   LEVEL 5 CAVEAT: PT WITH DEMENTIA   HPI Comments: Evan Maynard is a 81 y.o. male, with a PMhx of chronic renal disease, frequent falls, and Alzheimer's, brought in by ambulance, who presents to the Emergency Department complaining of a raised area to the right side of the back of his head and HTN s/p fall. Mr. Kanye was found on the floor of his bedroom room in [facility] where he resides. Per sister, pt's sitter found him in the room when she arrived to sit with him throughout the night and pt was trying to get up off the floor. The sitter noted the pt was with HTN s/p fall. Patient has a sitter who stays with him throughout the night, 7 days a week. Pt is ambulatory with a walker. He reports lower back pain at baseline. Sister reports pt is more alert than at baseline. PFhx of prostate cancer per sister. Sister denies pt taking any blood thinning medication.   PCP Dr Minna Antis  Patient is DO NOT RESUSCITATE  Past Medical History  Diagnosis Date  . Chronic kidney disease   . Cancer     prostate  . Frequent falls   . Alzheimer's dementia    History reviewed. No pertinent past surgical history. No family history on file. History  Substance Use Topics  . Smoking status: Never Smoker   . Smokeless tobacco: Not on file  . Alcohol Use: No  lives in NH Uses a walker  Review of Systems  Unable to perform ROS: Dementia  Musculoskeletal: Negative for myalgias and arthralgias.  Hematological: Does not bruise/bleed easily.   Allergies  Review of  patient's allergies indicates no known allergies.  Home Medications   Prior to Admission medications   Medication Sig Start Date End Date Taking? Authorizing Provider  acetaminophen (TYLENOL) 500 MG tablet Take 500 mg by mouth 3 (three) times daily as needed for moderate pain.    Historical Provider, MD  cephALEXin (KEFLEX) 500 MG capsule Take 1 capsule (500 mg total) by mouth 4 (four) times daily. Patient not taking: Reported on 03/28/2015 02/27/15   Veryl Speak, MD  furosemide (LASIX) 20 MG tablet Take 20 mg by mouth daily.  07/04/14   Historical Provider, MD  Nutritional Supplements (NUTRITIONAL DRINK PO) Take 1 Container by mouth 3 (three) times daily between meals. Great shakes    Historical Provider, MD  protein supplement (RESOURCE BENEPROTEIN) POWD Take 2 scoop by mouth 3 (three) times daily with meals. Mix into 4 oz juice    Historical Provider, MD  zinc gluconate 50 MG tablet Take 50 mg by mouth daily.    Historical Provider, MD   BP 136/78 mmHg  Pulse 66  Temp(Src) 98.5 F (36.9 C) (Oral)  Resp 21  SpO2 98%  Vital signs normal   Physical Exam  Constitutional: He appears well-developed and well-nourished.  Non-toxic appearance. He does not appear ill. No distress.  HENT:  Head: Normocephalic and atraumatic.  Right Ear: External ear  normal.  Left Ear: External ear normal.  Nose: Nose normal. No mucosal edema or rhinorrhea.  Mouth/Throat: Oropharynx is clear and moist and mucous membranes are normal. No dental abscesses or uvula swelling.  I go no see the contusion of his head.  Eyes: Conjunctivae and EOM are normal. Pupils are equal, round, and reactive to light.  Neck: Normal range of motion and full passive range of motion without pain. Neck supple.  nontender  Cardiovascular: Normal rate, regular rhythm and normal heart sounds.  Exam reveals no gallop and no friction rub.   No murmur heard. Pulmonary/Chest: Effort normal and breath sounds normal. No respiratory distress.  He has no wheezes. He has no rhonchi. He has no rales. He exhibits no tenderness and no crepitus.  Abdominal: Soft. Normal appearance and bowel sounds are normal. He exhibits no distension. There is no tenderness. There is no rebound and no guarding.  Musculoskeletal: Normal range of motion. He exhibits no edema or tenderness.  Moves all extremities well. Has some tenderness of the lower lumbar spine which his sister states is an old complaint  Neurological: He is alert. He has normal strength. No cranial nerve deficit.  cooperative  Skin: Skin is warm, dry and intact. No rash noted. No erythema. No pallor.  Psychiatric: He has a normal mood and affect. His speech is normal and behavior is normal. His mood appears not anxious.  Nursing note and vitals reviewed.   ED Course  Procedures  DIAGNOSTIC STUDIES: Oxygen Saturation is 98% on RA, normal by my interpretation.    COORDINATION OF CARE: 11:56 PM Discussed treatment plan with family. Family acknowledges and agrees to plan.   I talked to the patient's sister and she feels the patient is better than his usual in that he is more awake and alert and recognizes her. We discussed that if he did have a brain injury he was not a surgical candidate. Since he has no change in his mental status and he is not on a blood thinner CT of his head was not done and blood work and  urinalysis was not done tonight since they were done recently.  On review of patient's chart he has had 7 CT scans of his head this year including July 21, July 5, June 5, April 15, April 11, and April 6. He also had a lumbar spine x-ray done in April. On his visit on July 50 had lab work included a UA that was normal. He had a urine culture in April that showed no growth.   Labs Review Labs Reviewed - No data to display  Imaging Review No results found.   EKG Interpretation None      MDM   Final diagnoses:  Fall at nursing home, initial encounter   Plan  discharge  Rolland Porter, MD, FACEP   I personally performed the services described in this documentation, which was scribed in my presence. The recorded information has been reviewed and considered.  Rolland Porter, MD, Barbette Or, MD 04/21/15 [phone]

## 2015-04-20 NOTE — ED Notes (Signed)
PT arrives to the the ER via EMS from [facility]; pt had an unwitnessed fall; pt was attempting to get up when staff when in room; pt denies pain; pt noted to have hematoma to back right side of head; pt denies pain; unknown LOC; pt is alert and oriented to his normal per NH facility; pt with hx of Alzheimer's Dementia; pt able to move all extremities without difficulty

## 2015-04-21 NOTE — Discharge Instructions (Signed)
Return to the ED for any problems listed on the head injury sheet or change of usual behavior.

## 2015-06-15 ENCOUNTER — Emergency Department (HOSPITAL_COMMUNITY): Payer: Medicare Other

## 2015-06-15 ENCOUNTER — Encounter (HOSPITAL_COMMUNITY): Payer: Self-pay | Admitting: Emergency Medicine

## 2015-06-15 ENCOUNTER — Emergency Department (HOSPITAL_COMMUNITY)
Admission: EM | Admit: 2015-06-15 | Discharge: 2015-06-15 | Disposition: A | Discharge status: Home / Self Care | Payer: Medicare Other | Attending: Emergency Medicine | Admitting: Emergency Medicine

## 2015-06-15 DIAGNOSIS — S61402A Unspecified open wound of left hand, initial encounter: Secondary | ICD-10-CM | POA: Diagnosis not present

## 2015-06-15 DIAGNOSIS — W1839XA Other fall on same level, initial encounter: Secondary | ICD-10-CM | POA: Insufficient documentation

## 2015-06-15 DIAGNOSIS — S6992XA Unspecified injury of left wrist, hand and finger(s), initial encounter: Secondary | ICD-10-CM | POA: Diagnosis present

## 2015-06-15 DIAGNOSIS — Y92128 Other place in nursing home as the place of occurrence of the external cause: Secondary | ICD-10-CM | POA: Insufficient documentation

## 2015-06-15 DIAGNOSIS — W19XXXA Unspecified fall, initial encounter: Secondary | ICD-10-CM

## 2015-06-15 DIAGNOSIS — Y9389 Activity, other specified: Secondary | ICD-10-CM | POA: Diagnosis not present

## 2015-06-15 DIAGNOSIS — Y92129 Unspecified place in nursing home as the place of occurrence of the external cause: Secondary | ICD-10-CM

## 2015-06-15 DIAGNOSIS — Z8546 Personal history of malignant neoplasm of prostate: Secondary | ICD-10-CM | POA: Diagnosis not present

## 2015-06-15 DIAGNOSIS — G309 Alzheimer's disease, unspecified: Secondary | ICD-10-CM | POA: Insufficient documentation

## 2015-06-15 DIAGNOSIS — S41112A Laceration without foreign body of left upper arm, initial encounter: Secondary | ICD-10-CM

## 2015-06-15 DIAGNOSIS — Y998 Other external cause status: Secondary | ICD-10-CM | POA: Insufficient documentation

## 2015-06-15 DIAGNOSIS — N189 Chronic kidney disease, unspecified: Secondary | ICD-10-CM | POA: Diagnosis not present

## 2015-06-15 DIAGNOSIS — S61412A Laceration without foreign body of left hand, initial encounter: Secondary | ICD-10-CM

## 2015-06-15 DIAGNOSIS — Z79899 Other long term (current) drug therapy: Secondary | ICD-10-CM | POA: Insufficient documentation

## 2015-06-15 DIAGNOSIS — S41102A Unspecified open wound of left upper arm, initial encounter: Secondary | ICD-10-CM | POA: Diagnosis not present

## 2015-06-15 DIAGNOSIS — F0280 Dementia in other diseases classified elsewhere without behavioral disturbance: Secondary | ICD-10-CM | POA: Diagnosis not present

## 2015-06-15 NOTE — ED Notes (Signed)
Pt ambulated in hall with walker and supervision.

## 2015-06-15 NOTE — ED Notes (Addendum)
Resting quietly with eyes closed. Easily arousble. Verbally responsive. A/O. Resp even and unlabored. No audible adventitious breath sounds noted. ABC's intact. Pleasant cooperative behavior. NAD noted.

## 2015-06-15 NOTE — Discharge Instructions (Signed)
Pt will need a walked to prevent further falls and further possibility of sustaining injury.  He has had 10 falls since April 2016.    Fall Prevention and Home Safety Falls cause injuries and can affect all age groups. It is possible to use preventive measures to significantly decrease the likelihood of falls. There are many simple measures which can make your home safer and prevent falls. OUTDOORS  Repair cracks and edges of walkways and driveways.  Remove high doorway thresholds.  Trim shrubbery on the main path into your home.  Have good outside lighting.  Clear walkways of tools, rocks, debris, and clutter.  Check that handrails are not broken and are securely fastened. Both sides of steps should have handrails.  Have leaves, snow, and ice cleared regularly.  Use sand or salt on walkways during winter months.  In the garage, clean up grease or oil spills. BATHROOM  Install night lights.  Install grab bars by the toilet and in the tub and shower.  Use non-skid mats or decals in the tub or shower.  Place a plastic non-slip stool in the shower to sit on, if needed.  Keep floors dry and clean up all water on the floor immediately.  Remove soap buildup in the tub or shower on a regular basis.  Secure bath mats with non-slip, double-sided rug tape.  Remove throw rugs and tripping hazards from the floors. BEDROOMS  Install night lights.  Make sure a bedside light is easy to reach.  Do not use oversized bedding.  Keep a telephone by your bedside.  Have a firm chair with side arms to use for getting dressed.  Remove throw rugs and tripping hazards from the floor. KITCHEN  Keep handles on pots and pans turned toward the center of the stove. Use back burners when possible.  Clean up spills quickly and allow time for drying.  Avoid walking on wet floors.  Avoid hot utensils and knives.  Position shelves so they are not too high or low.  Place commonly used  objects within easy reach.  If necessary, use a sturdy step stool with a grab bar when reaching.  Keep electrical cables out of the way.  Do not use floor polish or wax that makes floors slippery. If you must use wax, use non-skid floor wax.  Remove throw rugs and tripping hazards from the floor. STAIRWAYS  Never leave objects on stairs.  Place handrails on both sides of stairways and use them. Fix any loose handrails. Make sure handrails on both sides of the stairways are as long as the stairs.  Check carpeting to make sure it is firmly attached along stairs. Make repairs to worn or loose carpet promptly.  Avoid placing throw rugs at the top or bottom of stairways, or properly secure the rug with carpet tape to prevent slippage. Get rid of throw rugs, if possible.  Have an electrician put in a light switch at the top and bottom of the stairs. OTHER FALL PREVENTION TIPS  Wear low-heel or rubber-soled shoes that are supportive and fit well. Wear closed toe shoes.  When using a stepladder, make sure it is fully opened and both spreaders are firmly locked. Do not climb a closed stepladder.  Add color or contrast paint or tape to grab bars and handrails in your home. Place contrasting color strips on first and last steps.  Learn and use mobility aids as needed. Install an electrical emergency response system.  Turn on lights to avoid  dark areas. Replace light bulbs that burn out immediately. Get light switches that glow.  Arrange furniture to create clear pathways. Keep furniture in the same place.  Firmly attach carpet with non-skid or double-sided tape.  Eliminate uneven floor surfaces.  Select a carpet pattern that does not visually hide the edge of steps.  Be aware of all pets. OTHER HOME SAFETY TIPS  Set the water temperature for 120 F (48.8 C).  Keep emergency numbers on or near the telephone.  Keep smoke detectors on every level of the home and near sleeping  areas. Document Released: 08/30/2002 Document Revised: 03/10/2012 Document Reviewed: 11/29/2011 [facility] Patient Information 2015 [place], [place]. This information is not intended to replace advice given to you by your health care provider. Make sure you discuss any questions you have with your health care provider.   Skin Tear Care A skin tear is when the top layer of skin peels off. To repair the skin, your doctor may use:   Tape.  Skin adhesive strips. HOME CARE  Change bandages (dressings) once a day or as told by your doctor.  Gently clean the area with salt (saline) solution or with a mild soap and water.  Do not rub the injured skin dry. Let the area air dry.  Put petroleum jelly or antibiotic cream on the tear. Do not allow a scab to form.  If the bandage sticks, moisten it with warm soapy water and remove it.  Protect the injured skin until it has healed.  Only take medicine as told by your doctor.  Take showers or baths using warm soapy water. Apply a new bandage after the shower or bath.  Keep all doctor visits as told. GET HELP RIGHT AWAY IF:   You have redness, puffiness (swelling), or more pain in the tear.  You have ayellowish-white fluid (pus) coming from the tear.  You have chills.  You have a red streak that goes away from the tear.  You have a bad smell coming from the tear or bandage.  You have a fever or lasting symptoms for more than 2-3 days.  You have a fever and your symptoms suddenly get worse. MAKE SURE YOU:   Understand these instructions.  Will watch this condition.  Will get help right away if you are not doing well or get worse. Document Released: 06/18/2008 Document Revised: 06/03/2012 Document Reviewed: 03/23/2012 [facility] Patient Information 2015 [place], [place]. This information is not intended to replace advice given to you by your health care provider. Make sure you discuss any questions you have with your health care  provider.

## 2015-06-15 NOTE — ED Provider Notes (Signed)
CSN: [id number]     Arrival date & time 06/15/15  0809 History   First MD Initiated Contact with Patient 06/15/15 [phone]     Chief Complaint  Patient presents with  . Fall     (Consider location/radiation/quality/duration/timing/severity/associated sxs/prior Treatment) HPI   Patient is a 81 year old male sent for evaluation after a fall at a nursing home. He has history of frequent falls and dementia. Per EMS he is at his baseline mental status and there is no reports of head trauma or loss of consciousness. The patient states that he had a mechanical fall that occurred over a new area of flooring at the nursing home, he believes it was recently cleaned and he slipped subsequently sustaining skin tears to his left forearm and hand.  He denies headache, back pain, chest pain, shortness of breath, abdominal pain, dizziness, lightheadedness, nausea or vomiting, visual disturbances.  He has no other complaints at this time   Past Medical History  Diagnosis Date  . Chronic kidney disease   . Cancer     prostate  . Frequent falls   . Alzheimer's dementia    History reviewed. No pertinent past surgical history. Family History  Problem Relation Age of Onset  . Family history unknown: Yes   Social History  Substance Use Topics  . Smoking status: Never Smoker   . Smokeless tobacco: None  . Alcohol Use: No    Review of Systems  Unable to perform ROS: Dementia  Eyes: Negative for photophobia.  Respiratory: Negative for cough, shortness of breath and wheezing.   Cardiovascular: Negative for chest pain.  Gastrointestinal: Negative for nausea, vomiting and abdominal pain.  Musculoskeletal: Negative for back pain and neck pain.  Skin: Positive for wound.  Neurological: Negative for facial asymmetry and headaches.    Allergies  Review of patient's allergies indicates no known allergies.  Home Medications   Prior to Admission medications   Medication Sig Start Date End Date Taking?  Authorizing Provider  acetaminophen (TYLENOL) 500 MG tablet Take 500 mg by mouth 3 (three) times daily.    Yes Historical Provider, MD  furosemide (LASIX) 20 MG tablet Take 20 mg by mouth daily.  07/04/14  Yes Historical Provider, MD  Nutritional Supplements (NUTRITIONAL DRINK PO) Take 1 Container by mouth 3 (three) times daily between meals. Great shakes   Yes Historical Provider, MD  protein supplement (RESOURCE BENEPROTEIN) POWD Take 2 scoop by mouth 3 (three) times daily with meals. Mix into 4 oz juice   Yes Historical Provider, MD  zinc gluconate 50 MG tablet Take 50 mg by mouth daily.   Yes Historical Provider, MD  cephALEXin (KEFLEX) 500 MG capsule Take 1 capsule (500 mg total) by mouth 4 (four) times daily. Patient not taking: Reported on 03/28/2015 02/27/15   Veryl Speak, MD   BP 168/73 mmHg  Pulse 68  Temp(Src) 98.1 F (36.7 C) (Oral)  Resp 20  SpO2 99% Physical Exam  Constitutional: He appears well-developed and well-nourished. No distress.  HENT:  Head: Normocephalic and atraumatic.  Nose: Nose normal.  Mouth/Throat: Oropharynx is clear and moist. No oropharyngeal exudate.  Eyes: Conjunctivae and EOM are normal. Pupils are equal, round, and reactive to light. Right eye exhibits no discharge. Left eye exhibits no discharge. No scleral icterus.  Neck: Normal range of motion. No JVD present. No tracheal deviation present. No thyromegaly present.  Cardiovascular: Normal rate, regular rhythm, normal heart sounds and intact distal pulses.  Exam reveals no gallop and no friction  rub.   No murmur heard. Pulmonary/Chest: Effort normal and breath sounds normal. No respiratory distress. He has no wheezes. He has no rales. He exhibits no tenderness.  Abdominal: Soft. Bowel sounds are normal. He exhibits no distension and no mass. There is no tenderness. There is no rebound and no guarding.  Musculoskeletal: Normal range of motion. He exhibits no edema or tenderness.  No midline tenderness  from the cervical spine to sacrum, moving all extremities  Lymphadenopathy:    He has no cervical adenopathy.  Neurological: He is alert. He has normal reflexes. No cranial nerve deficit. He exhibits normal muscle tone. Coordination normal.  Skin: Skin is warm and dry. No rash noted. He is not diaphoretic. No erythema. No pallor.  Skin tear of his left dorsal hand approximately 2 x 2 centimeters between his second and third MCP Larger skin tear to his left anterior forearm  Diffuse scars to his bilateral arms, multiple dressings in place. Lower extremities are erythematous with multiple scars and hyperpigmented areas, no pitting edema  Psychiatric: He has a normal mood and affect. His behavior is normal. Judgment and thought content normal.  Nursing note and vitals reviewed.               ED Course  Procedures (including critical care time) Labs Review Labs Reviewed - No data to display  Imaging Review No results found. I have personally reviewed and evaluated these images and lab results as part of my medical decision-making.   EKG Interpretation None      MDM   Final diagnoses:  Skin tear of left hand without complication, initial encounter  Skin tear of left upper arm without complication, initial encounter  Fall at nursing home, initial encounter    Patient with frequent falls, skin tear to his left forearm and left hand today, without any loss of consciousness, reportedly at his baseline mental status which is dementia, he is pleasantly demented, answering questions and following commands.  His skin tears were reinforced with Steri-Strips and dressing was applied. Dr. Kathrynn Humble has seen and evaluated the pt as well.  Head CT was ordered, there was no acute intracranial pathology or abnormality revealed.  The patient apparently has frequent falls and has a walker available to nursing home but refuses to use it. Instructions were included to encourage use of a  walker given his multiple frequent falls over the past several months.  Patient was discharged home and transported by [facility].  Patient's vital signs were stable, and patient was discharged home in satisfactory condition     Delsa Grana, PA-C 06/23/15 0510     Medical screening examination/treatment/procedure(s) were conducted as a shared visit with non-physician practitioner(s) and myself.  I personally evaluated the patient during the encounter.   EKG Interpretation None       Pt with fall. Frequent fall. Pt has some skin tears. Appropriate imaging ordered.   Varney Biles, MD 06/29/15 [phone]

## 2015-06-15 NOTE — ED Notes (Addendum)
Non adhesive dressing applied to wounds. Pt awaiting d/c.

## 2015-06-15 NOTE — ED Notes (Addendum)
Pt arrived via EMS from [facility] with report of falling without injuries. Pt reported that during ambulation he slipped and fell causing skin tears to rt hand and RFA with controlled bleeding noted. Pt denies hitting head, LOC, dizziness/lightheadedness, nausea, or visual disturbances.

## 2015-06-15 NOTE — ED Notes (Signed)
Applied wet dsg to skin tear on lt hand. Pt tolerated well.

## 2015-06-15 NOTE — ED Notes (Signed)
Bed: OB09 Expected date:  Expected time:  Means of arrival:  Comments: EMS- 81yo M, fall/skin tears

## 2015-06-15 NOTE — ED Notes (Signed)
Wound to L forearm cleaned and steri strips applied.

## 2015-06-15 NOTE — ED Notes (Signed)
Pt awaiting wound care with PA. Pt resting comfortably.

## 2015-07-01 IMAGING — CT CT HEAD W/O CM
3 of 7 series · 14 of 47 positions shown, 17 images · non-contrast
Comparison: None.

CLINICAL DATA: [HOSPITAL] patient with unwitnessed fall.
Dementia. Facial deformity.

EXAM:
CT HEAD WITHOUT CONTRAST
CT MAXILLOFACIAL WITHOUT CONTRAST
CT CERVICAL SPINE WITHOUT CONTRAST
TECHNIQUE: Multidetector CT imaging of the head, cervical spine, and
maxillofacial structures were performed using the standard protocol
without intravenous contrast. Multiplanar CT image reconstructions
of the cervical spine and maxillofacial structures were also
generated.

[Series 3: facial st · axial · 0.32mm/px · z∈[+1177,+1319]mm · 9 of 89 slices shown, 12 images]
[im 9/89  brain]
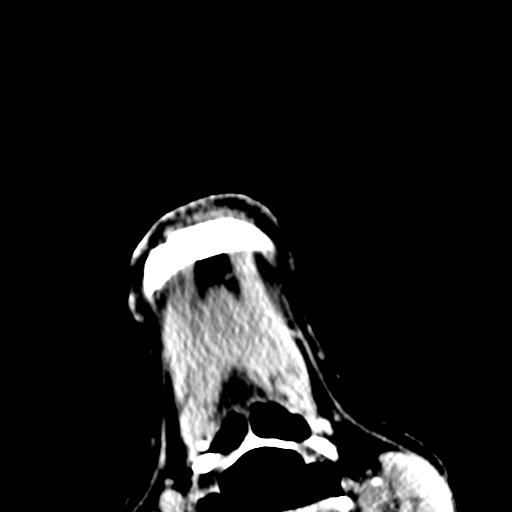
[im 9/89  bone]
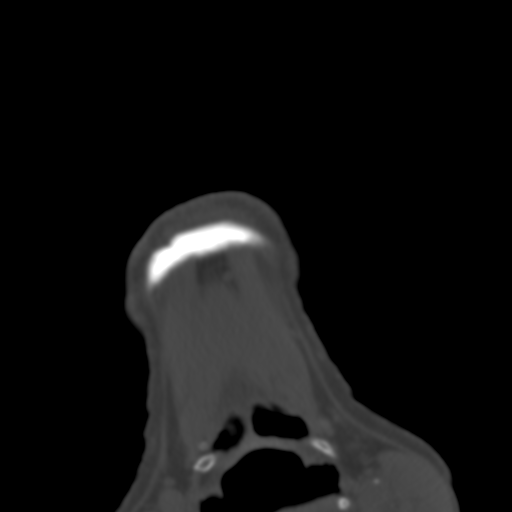
[im 18/89  brain]
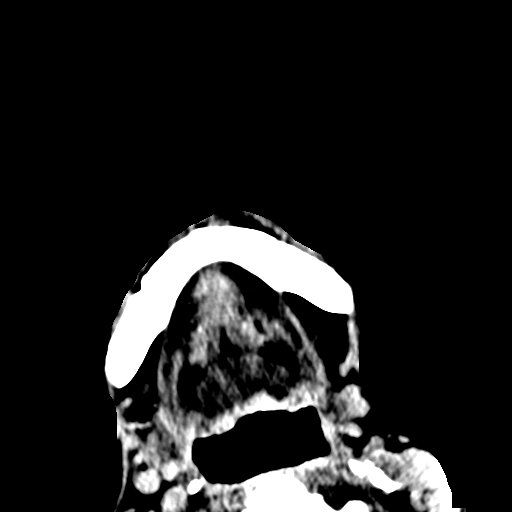
[im 27/89  brain]
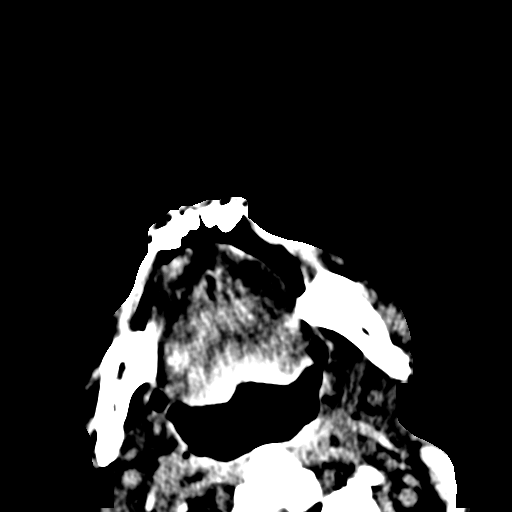
[im 36/89  brain]
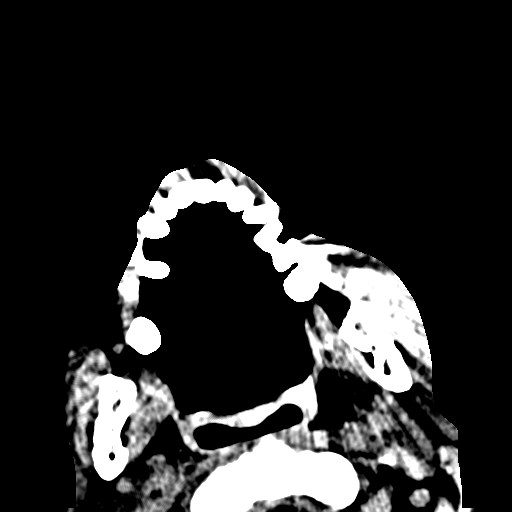
[im 45/89  brain]
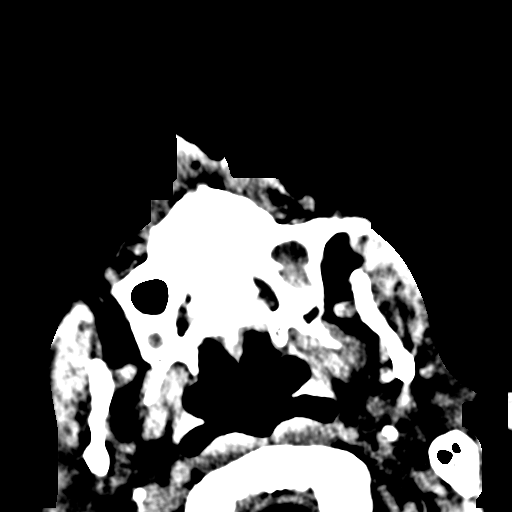
[im 45/89  bone]
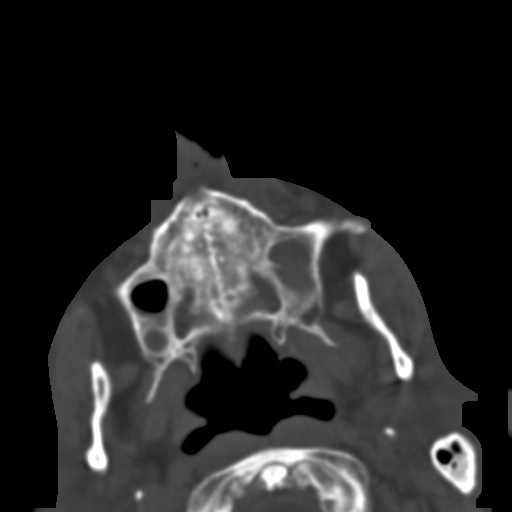
[im 53/89  brain]
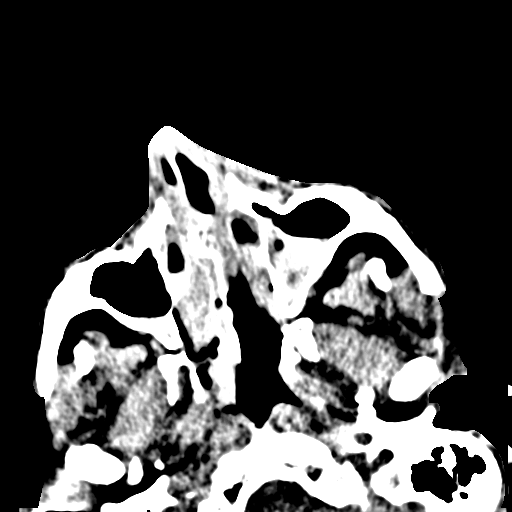
[im 62/89  brain]
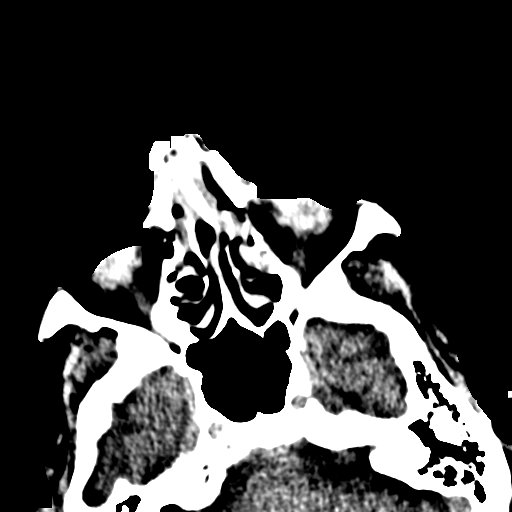
[im 71/89  brain]
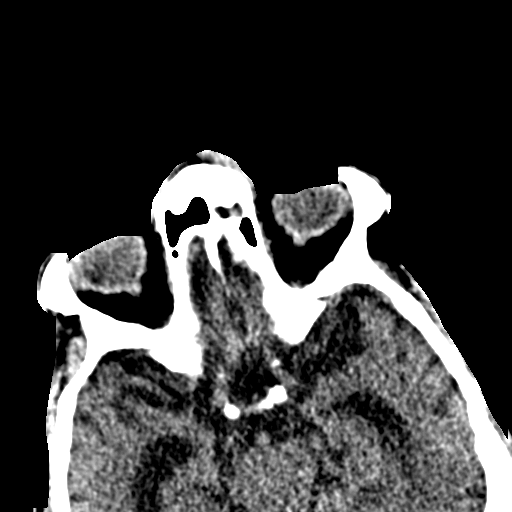
[im 80/89  brain]
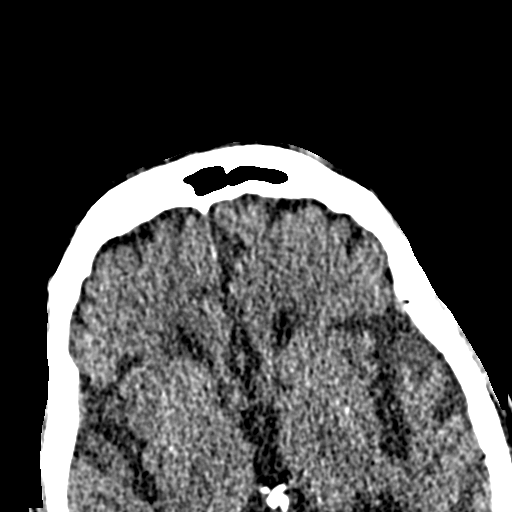
[im 80/89  bone]
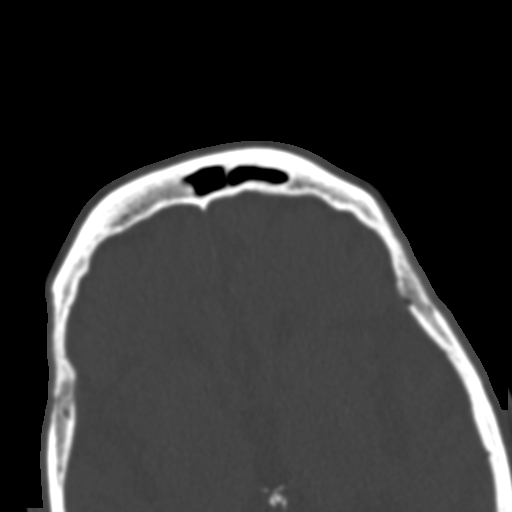

[Series 5: coronal st · coronal · 0.34mm/px · 3 of 79 slices shown]
[im 27/79  brain]
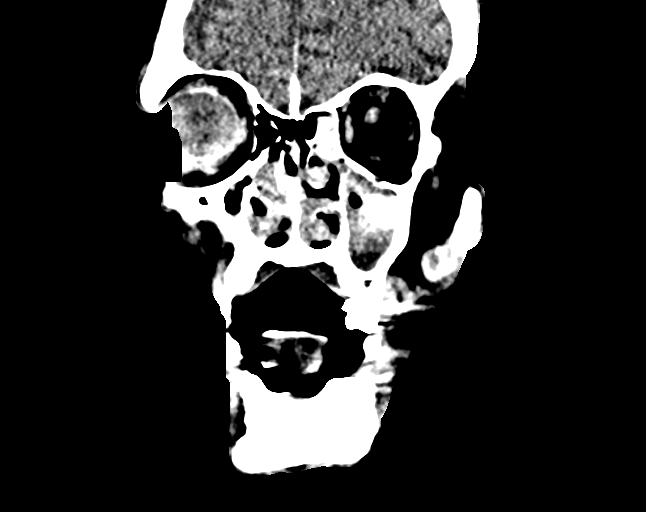
[im 35/79  brain]
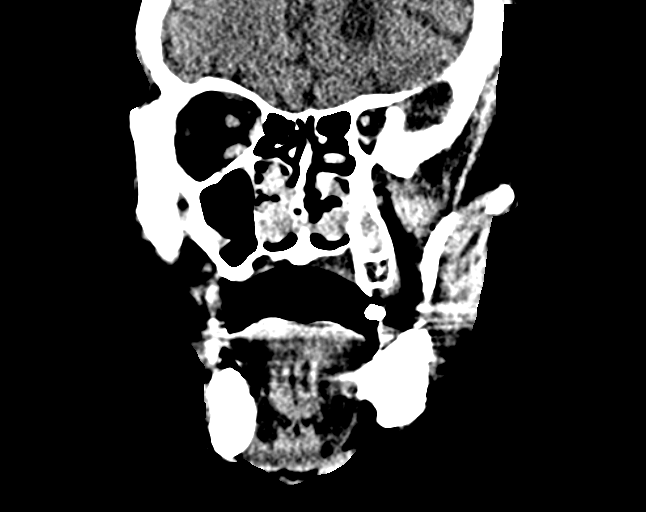
[im 44/79  brain]
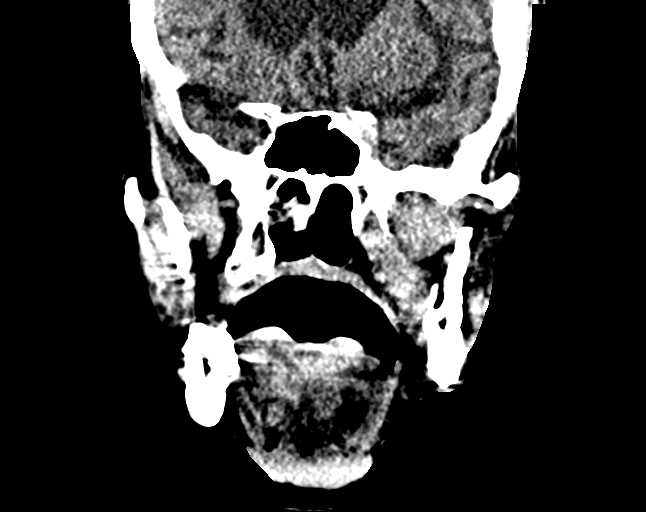

[Series 6: sagittal st · sagittal · 0.34mm/px · 2 of 76 slices shown]
[im 26/76  brain]
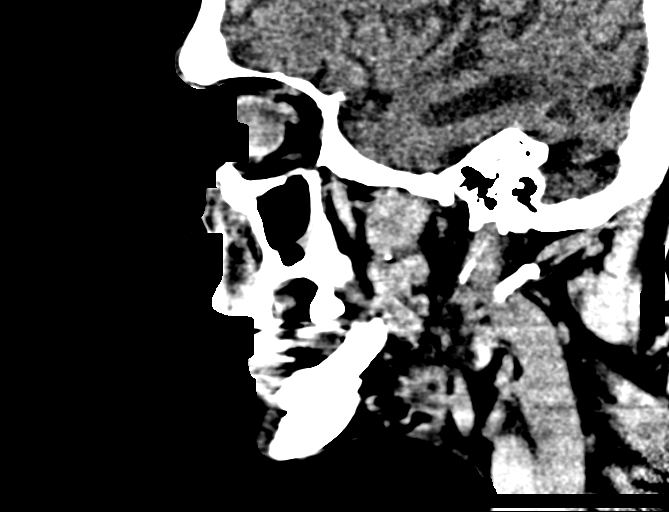
[im 51/76  brain]
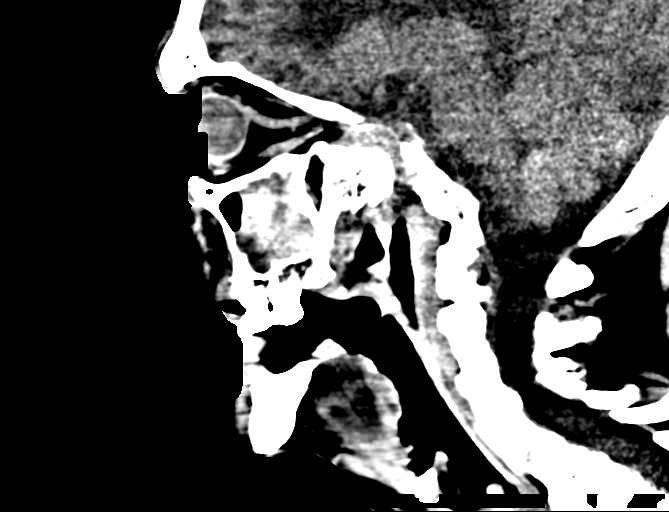

[14 of 47 positions shown; findings below may reference images not displayed]

FINDINGS: CT HEAD FINDINGS

No evidence for acute infarction, hemorrhage, mass lesion,
hydrocephalus, or extra-axial fluid. Global atrophy. Small vessel
disease. Calvarium intact.

CT MAXILLOFACIAL FINDINGS

Comminuted nasal bone fracture with displacement left-to-right.
Nasal septal deformity also likely acute. Swollen nasal turbinates,
suspected nasal cavity hematoma. Layering fluid in both maxillary
sinuses, greater on the LEFT could also represent hemorrhage. No
inferior or medial blowout fracture. BILATERAL TMJ arthritis. No
orbital hematoma. No other facial fractures. Minimal layering fluid
in the sphenoid. Layering fluid in the LEFT frontal sinus. Some
fluid accumulation in the BILATERAL LEFT greater than RIGHT anterior
ethmoid air cells.

CT CERVICAL SPINE FINDINGS

There is no visible cervical spine fracture, traumatic subluxation,
prevertebral soft tissue swelling, or intraspinal hematoma. Advanced
degenerative change with facet arthropathy and disc space narrowing
is stable. Atherosclerosis. No pneumothorax.
IMPRESSION: Atrophy and small vessel disease.  No acute intracranial findings.

No cervical spine fracture or traumatic subluxation.

Comminuted nasal bone fracture, and acute nasal septal deformity
with nasal cavity hematoma, layering fluid or blood throughout much
of the paranasal sinuses. No other facial bone fractures.

## 2015-08-29 ENCOUNTER — Encounter (HOSPITAL_COMMUNITY): Payer: Self-pay | Admitting: Emergency Medicine

## 2015-08-29 ENCOUNTER — Inpatient Hospital Stay (HOSPITAL_COMMUNITY)
Admission: EM | Admit: 2015-08-29 | Discharge: 2015-09-01 | DRG: 871 | Disposition: A | Discharge status: Nursing Home (Includes ICF) | Payer: Medicare Other | Attending: Family Medicine | Admitting: Family Medicine

## 2015-08-29 ENCOUNTER — Inpatient Hospital Stay (HOSPITAL_COMMUNITY): Payer: Medicare Other

## 2015-08-29 DIAGNOSIS — E870 Hyperosmolality and hypernatremia: Secondary | ICD-10-CM | POA: Diagnosis present

## 2015-08-29 DIAGNOSIS — G92 Toxic encephalopathy: Secondary | ICD-10-CM | POA: Diagnosis present

## 2015-08-29 DIAGNOSIS — Z8546 Personal history of malignant neoplasm of prostate: Secondary | ICD-10-CM | POA: Diagnosis not present

## 2015-08-29 DIAGNOSIS — Z66 Do not resuscitate: Secondary | ICD-10-CM | POA: Diagnosis present

## 2015-08-29 DIAGNOSIS — R6520 Severe sepsis without septic shock: Secondary | ICD-10-CM | POA: Diagnosis present

## 2015-08-29 DIAGNOSIS — Z515 Encounter for palliative care: Secondary | ICD-10-CM | POA: Diagnosis present

## 2015-08-29 DIAGNOSIS — Z79899 Other long term (current) drug therapy: Secondary | ICD-10-CM

## 2015-08-29 DIAGNOSIS — I129 Hypertensive chronic kidney disease with stage 1 through stage 4 chronic kidney disease, or unspecified chronic kidney disease: Secondary | ICD-10-CM | POA: Diagnosis present

## 2015-08-29 DIAGNOSIS — G309 Alzheimer's disease, unspecified: Secondary | ICD-10-CM | POA: Diagnosis not present

## 2015-08-29 DIAGNOSIS — A419 Sepsis, unspecified organism: Secondary | ICD-10-CM | POA: Diagnosis not present

## 2015-08-29 DIAGNOSIS — L89159 Pressure ulcer of sacral region, unspecified stage: Secondary | ICD-10-CM | POA: Diagnosis present

## 2015-08-29 DIAGNOSIS — R4182 Altered mental status, unspecified: Secondary | ICD-10-CM | POA: Diagnosis not present

## 2015-08-29 DIAGNOSIS — Z7189 Other specified counseling: Secondary | ICD-10-CM | POA: Diagnosis not present

## 2015-08-29 DIAGNOSIS — N189 Chronic kidney disease, unspecified: Secondary | ICD-10-CM | POA: Diagnosis present

## 2015-08-29 DIAGNOSIS — E872 Acidosis: Secondary | ICD-10-CM | POA: Diagnosis present

## 2015-08-29 DIAGNOSIS — R296 Repeated falls: Secondary | ICD-10-CM | POA: Diagnosis present

## 2015-08-29 DIAGNOSIS — F0280 Dementia in other diseases classified elsewhere without behavioral disturbance: Secondary | ICD-10-CM | POA: Diagnosis present

## 2015-08-29 DIAGNOSIS — L8990 Pressure ulcer of unspecified site, unspecified stage: Secondary | ICD-10-CM | POA: Insufficient documentation

## 2015-08-29 DIAGNOSIS — I959 Hypotension, unspecified: Secondary | ICD-10-CM | POA: Diagnosis not present

## 2015-08-29 DIAGNOSIS — N179 Acute kidney failure, unspecified: Secondary | ICD-10-CM | POA: Diagnosis present

## 2015-08-29 LAB — CBC WITH DIFFERENTIAL/PLATELET
Basophils Absolute: 0 10*3/uL (ref 0.0–0.1)
Basophils Relative: 0 %
Eosinophils Absolute: 0 10*3/uL (ref 0.0–0.7)
Eosinophils Relative: 0 %
HEMATOCRIT: 41.5 % (ref 39.0–52.0)
Hemoglobin: 13.2 g/dL (ref 13.0–17.0)
LYMPHS PCT: 6 %
Lymphs Abs: 1.1 10*3/uL (ref 0.7–4.0)
MCH: 30 pg (ref 26.0–34.0)
MCHC: 31.8 g/dL (ref 30.0–36.0)
MCV: 94.3 fL (ref 78.0–100.0)
MONO ABS: 1.1 10*3/uL — AB (ref 0.1–1.0)
MONOS PCT: 6 %
NEUTROS PCT: 88 %
Neutro Abs: 16 10*3/uL — ABNORMAL HIGH (ref 1.7–7.7)
Platelets: 261 10*3/uL (ref 150–400)
RBC: 4.4 MIL/uL (ref 4.22–5.81)
RDW: 15 % (ref 11.5–15.5)
WBC: 18.2 10*3/uL — ABNORMAL HIGH (ref 4.0–10.5)

## 2015-08-29 LAB — I-STAT CG4 LACTIC ACID, ED: Lactic Acid, Venous: 3.07 mmol/L (ref 0.5–2.0)

## 2015-08-29 MED ORDER — ACETAMINOPHEN 325 MG PO TABS: 650.0000 mg | ORAL_TABLET | Freq: Four times a day (QID) | ORAL | Status: DC | PRN

## 2015-08-29 MED ORDER — ACETAMINOPHEN 500 MG PO TABS
500.0000 mg | ORAL_TABLET | Freq: Three times a day (TID) | ORAL | Status: DC
  Administered 2015-08-30: 500 mg via ORAL
  Filled 2015-08-29 (×2): qty 1

## 2015-08-29 MED ORDER — ACETAMINOPHEN 650 MG RE SUPP: 650.0000 mg | Freq: Four times a day (QID) | RECTAL | Status: DC | PRN

## 2015-08-29 MED ORDER — PIPERACILLIN-TAZOBACTAM 3.375 G IVPB 30 MIN
3.3750 g | Freq: Once | INTRAVENOUS | Status: AC
  Administered 2015-08-30: 3.375 g via INTRAVENOUS
  Filled 2015-08-29: qty 50

## 2015-08-29 MED ORDER — ONDANSETRON HCL 4 MG/2ML IJ SOLN: 4.0000 mg | Freq: Four times a day (QID) | INTRAMUSCULAR | Status: DC | PRN

## 2015-08-29 MED ORDER — SODIUM CHLORIDE 0.9 % IV BOLUS (SEPSIS)
1000.0000 mL | INTRAVENOUS | Status: AC
  Administered 2015-08-30: 1000 mL via INTRAVENOUS

## 2015-08-29 MED ORDER — ONDANSETRON HCL 4 MG PO TABS: 4.0000 mg | ORAL_TABLET | Freq: Four times a day (QID) | ORAL | Status: DC | PRN

## 2015-08-29 MED ORDER — ZINC SULFATE 220 (50 ZN) MG PO CAPS
220.0000 mg | ORAL_CAPSULE | Freq: Every day | ORAL | Status: DC
  Filled 2015-08-29 (×3): qty 1

## 2015-08-29 MED ORDER — SODIUM CHLORIDE 0.9 % IV BOLUS (SEPSIS)
1000.0000 mL | Freq: Once | INTRAVENOUS | Status: AC
  Administered 2015-08-29: 1000 mL via INTRAVENOUS

## 2015-08-29 MED ORDER — MORPHINE SULFATE (PF) 2 MG/ML IV SOLN
1.0000 mg | INTRAVENOUS | Status: DC | PRN
Start: 2015-08-29 — End: 2015-09-01
  Administered 2015-08-31 – 2015-09-01 (×2): 1 mg via INTRAVENOUS
  Filled 2015-08-29 (×3): qty 1

## 2015-08-29 MED ORDER — DOCUSATE SODIUM 100 MG PO CAPS
100.0000 mg | ORAL_CAPSULE | Freq: Two times a day (BID) | ORAL | Status: DC
  Administered 2015-08-30: 100 mg via ORAL
  Filled 2015-08-29 (×2): qty 1

## 2015-08-29 MED ORDER — BENEPROTEIN PO POWD
2.0000 | Freq: Three times a day (TID) | ORAL | Status: DC
  Filled 2015-08-29: qty 227

## 2015-08-29 MED ORDER — VANCOMYCIN HCL IN DEXTROSE 1-5 GM/200ML-% IV SOLN
1000.0000 mg | Freq: Once | INTRAVENOUS | Status: AC
  Administered 2015-08-30: 1000 mg via INTRAVENOUS
  Filled 2015-08-29: qty 200

## 2015-08-29 MED ORDER — HEPARIN SODIUM (PORCINE) 5000 UNIT/ML IJ SOLN
5000.0000 [IU] | Freq: Three times a day (TID) | INTRAMUSCULAR | Status: DC
  Administered 2015-08-30 – 2015-09-01 (×8): 5000 [IU] via SUBCUTANEOUS
  Filled 2015-08-29 (×8): qty 1

## 2015-08-29 MED ORDER — SODIUM CHLORIDE 0.9 % IV SOLN
INTRAVENOUS | Status: DC
  Administered 2015-08-30 (×2): via INTRAVENOUS

## 2015-08-29 NOTE — ED Notes (Signed)
Evan Maynard, EDP made aware of patient I-stat lactic result.

## 2015-08-29 NOTE — ED Notes (Signed)
EDP Knott notified of critical Lactic Acid

## 2015-08-29 NOTE — ED Notes (Signed)
Bed: WA09 Expected date:  Expected time:  Means of arrival:  Comments: Lethargy to go to rm 12

## 2015-08-29 NOTE — ED Provider Notes (Signed)
CSN: [id number]     Arrival date & time 08/29/15  1819 History   First MD Initiated Contact with Patient 08/29/15 1844     No chief complaint on file.    (Consider location/radiation/quality/duration/timing/severity/associated sxs/prior Treatment) Patient is a 81 y.o. male presenting with altered mental status. The history is provided by a relative.  Altered Mental Status Presenting symptoms: disorientation, lethargy and partial responsiveness   Severity:  Severe Most recent episode:  Today Episode history:  Continuous Timing:  Constant Progression:  Worsening Chronicity:  Recurrent Context: dementia and nursing home resident   Associated symptoms: slurred speech   Associated symptoms: no vomiting   Associated symptoms comment:  Loss of appetite, progressive decline in functional status   Past Medical History  Diagnosis Date  . Chronic kidney disease   . Cancer [facility])     prostate  . Frequent falls   . Alzheimer's dementia    History reviewed. No pertinent past surgical history. Family History  Problem Relation Age of Onset  . Family history unknown: Yes   Social History  Substance Use Topics  . Smoking status: Never Smoker   . Smokeless tobacco: None  . Alcohol Use: No    Review of Systems  Unable to perform ROS: Patient nonverbal  Gastrointestinal: Negative for vomiting.      Allergies  Review of patient's allergies indicates no known allergies.  Home Medications   Prior to Admission medications   Medication Sig Start Date End Date Taking? Authorizing Provider  acetaminophen (TYLENOL) 500 MG tablet Take 500 mg by mouth 3 (three) times daily.    Yes Historical Provider, MD  cephALEXin (KEFLEX) 500 MG capsule Take 500 mg by mouth 2 (two) times daily. 08/27/15 09/23/2015 Yes Historical Provider, MD  ergocalciferol (VITAMIN D2) 50000 UNITS capsule Take 50,000 Units by mouth every 30 (thirty) days.   Yes Historical Provider, MD  furosemide (LASIX) 20 MG tablet  Take 20 mg by mouth daily.  07/04/14  Yes Historical Provider, MD  lisinopril (PRINIVIL,ZESTRIL) 5 MG tablet Take 5 mg by mouth daily.   Yes Historical Provider, MD  LORazepam (ATIVAN) 0.5 MG tablet Take 0.25 mg by mouth 2 (two) times daily.   Yes Historical Provider, MD  Nutritional Supplements (NUTRITIONAL DRINK PO) Take 1 Container by mouth 3 (three) times daily between meals. Great shakes   Yes Historical Provider, MD  Probiotic Product (PROBIOTIC PO) Take 1 capsule by mouth 2 (two) times daily. 08/27/15 09/10/15 Yes Historical Provider, MD  protein supplement (RESOURCE BENEPROTEIN) POWD Take 2 scoop by mouth 3 (three) times daily with meals. Mix into 4 oz juice   Yes Historical Provider, MD  QUEtiapine (SEROQUEL) 25 MG tablet Take 25 mg by mouth 2 (two) times daily.    Yes Historical Provider, MD  traMADol (ULTRAM) 50 MG tablet Take 50 mg by mouth every 12 (twelve) hours as needed for moderate pain.   Yes Historical Provider, MD  zinc gluconate 50 MG tablet Take 50 mg by mouth daily.   Yes Historical Provider, MD   BP 60/40 mmHg  Pulse 95  Resp 16  SpO2 95% Physical Exam  Constitutional: He appears lethargic. He appears cachectic. He appears toxic. No distress.  HENT:  Head: Normocephalic and atraumatic.  Eyes: Conjunctivae are normal.  Neck: Neck supple. No tracheal deviation present.  Cardiovascular: Normal rate, regular rhythm and normal heart sounds.   Pulmonary/Chest: No accessory muscle usage. No respiratory distress. He has rales.  Abdominal: Soft. He exhibits no distension.  There is no tenderness.  Neurological: He appears lethargic.  Minimally responsive  Skin: Skin is warm and dry.    ED Course  Procedures (including critical care time) Labs Review Labs Reviewed  CBC WITH DIFFERENTIAL/PLATELET - Abnormal; Notable for the following:    WBC 18.2 (*)    Neutro Abs 16.0 (*)    Monocytes Absolute 1.1 (*)    All other components within normal limits  I-STAT CG4 LACTIC  ACID, ED - Abnormal; Notable for the following:    Lactic Acid, Venous 3.07 (*)    All other components within normal limits  CULTURE, BLOOD (ROUTINE X 2)  CULTURE, BLOOD (ROUTINE X 2)  C DIFFICILE QUICK SCREEN W PCR REFLEX  CBC WITH DIFFERENTIAL/PLATELET  COMPREHENSIVE METABOLIC PANEL  LACTIC ACID, PLASMA  LACTIC ACID, PLASMA  PROCALCITONIN  PROTIME-INR  APTT  CBC    Imaging Review Dg Chest Port 1 View  08/30/2015  CLINICAL DATA:  Acute onset of confusion, diarrhea, weakness, chills and fever. Initial encounter. EXAM: PORTABLE CHEST 1 VIEW COMPARISON:  Chest radiograph from 04/13/2015 FINDINGS: The lungs are well-aerated. Peribronchial thickening is noted. Increased interstitial markings could reflect mild interstitial edema. A likely 1.3 cm calcified granuloma is noted at the right midlung zone, unchanged from April. There is no evidence of pleural effusion or pneumothorax. The cardiomediastinal silhouette is within normal limits. No acute osseous abnormalities are seen. IMPRESSION: Peribronchial thickening noted. Increased interstitial markings could reflect mild interstitial edema. Electronically Signed   By: Garald Balding M.D.   On: 08/30/2015 01:24   I have personally reviewed and evaluated these images and lab results as part of my medical decision-making.   EKG Interpretation None      MDM   Final diagnoses:  Palliative care encounter  Severe sepsis (HCC)  Hypotension, unspecified hypotension type    81 y.o. male presents with progressive lethargy, decreased appetite, diarrhea, hypotensive on arrival with minimal interaction with staff. Daughter is available at bedside and is healthcare power of attorney. I discussed the patient's hypotension and asked what goals of care were appropriate given his current situation. She is concerned that he has no quality of life currently and is interested in pursuing a comfort care course of treatment going forward with hospice and is  not interested in further medical evaluation, workup, or treatment including antibiotic therapy or any artificial life support measures except for limited IV fluids to potentially help with hypotension and pain management. At this time I feel that is appropriate given the patient's clinical status.  Screening labs are ordered prior to admission with plan for palliative care consultation and transition to full comfort care and hospice placement. Hospitalist was consulted for admission and will see the patient in the emergency department.     Leo Grosser, MD 08/30/15 [phone]

## 2015-08-29 NOTE — ED Notes (Signed)
Unable to collect labs at this time nurse working with patient.

## 2015-08-29 NOTE — ED Notes (Signed)
Case manager at pt bedside  °

## 2015-08-29 NOTE — Progress Notes (Signed)
EDCM consulted to speak to patient's family about hospice/plaaiative care.  EDCM spoke to patient's daughter Evan Maynard who is patient's POA.  Evan Maynard confirms patient is from memory care unit at [facility].  Patient was found today slumped over in wheelchair, cold to touch nonverbal.  Patient having decreased po intake for a week with diarrhea.  Patient's daughter reports that there is a possibility of prostate cancer and reports patient's kidney's "aren't good."  Patient's daughter would like comfort care provided for the patient.  Evan Maynard reports, "He could be septic, could be his kidneys, I don't know what it could be."  [facility] informed patient's daughter that we will be unable to distinguish exactly what is occurring with the patient without having blood work done.  Patient's daughter reports she is not opposed to having initial blood work drawn and IV fluids, "As long as it's not like 6 liters of IV fluids and admitting him to the ICU and taking his vital signs every fifteen minuets." Patient's daughter reports, "I don't want to do anything that is going to prolong him being this way."  [facility] informed patient's daughter that the patient will be kept comfortable and the hospital will only do what the family requests and in the best interest in the patient.  Patient's daughter agreeable to palliative care consult.  Patient's daughter reports, "The facility will not be able to take care of him like this."  Discussed with EDP.  No further EDCM needs at this time.

## 2015-08-29 NOTE — ED Notes (Signed)
Patient is no verbal.  Family members states he is in overall pain.  They want to make him comfortable at this time. MD spoke to family member

## 2015-08-29 NOTE — ED Notes (Signed)
Pt's daughter at bedside.  Pt is DNR.  Daughter would like pt to be kept comfortable.  Is willing to speak with physician about what they think is best regarding possible abx or blood test.

## 2015-08-29 NOTE — H&P (Signed)
[facility] History and Physical  Evan Maynard [id number] DOB: Dec 12, 1927 DOA: 08/29/2015  Referring physician: Leo Grosser, MD PCP: Tommy Medal, MD   Chief Complaint: Altered Mental Status  HPI: Evan Maynard is a 81 y.o. male with prior history of CKD Alzheimers dementia falling episodes who presented to the ED with altered mental status. Patient is not able to provide a history. The patient was found at the memory care unit slumped over in his chair. Patient has been declining according to his daughter for a while now. He does have a history of kidney disease in the past. He also was significantly hypotensive here in the ED. On initial presentation his daughter did not want anything heroic done. She understands that he may pass and agreed to allow some blood work to be done and also to start him on antibiotics. She also did agree to give him fluids after I spoke to her she states that it is ok to provide him with 2 liters fluids and maintenance she does not want him to be flooded with fluids however. She would also like for palliative care to be involved. She is an ICU nurse and does not want him to suffer. He is a DNR and she wants to keep him a DNR.   Review of Systems:  Patient is not able to provide a ROS.   Past Medical History  Diagnosis Date  . Chronic kidney disease   . Cancer [name]  . Frequent falls   . Alzheimer's dementia    History reviewed. No pertinent past surgical history. Social History:  reports that he has never smoked. He does not have any smokeless tobacco history on file. He reports that he does not drink alcohol or use illicit drugs.  No Known Allergies  Family History  Problem Relation Age of Onset  . Family history unknown: Yes     Prior to Admission medications   Medication Sig Start Date End Date Taking? Authorizing Provider  acetaminophen (TYLENOL) 500 MG tablet Take 500 mg by mouth 3 (three) times daily.    Yes Historical  Provider, MD  cephALEXin (KEFLEX) 500 MG capsule Take 500 mg by mouth 2 (two) times daily. 08/27/15 08/25/2015 Yes Historical Provider, MD  ergocalciferol (VITAMIN D2) 50000 UNITS capsule Take 50,000 Units by mouth every 30 (thirty) days.   Yes Historical Provider, MD  furosemide (LASIX) 20 MG tablet Take 20 mg by mouth daily.  07/04/14  Yes Historical Provider, MD  lisinopril (PRINIVIL,ZESTRIL) 5 MG tablet Take 5 mg by mouth daily.   Yes Historical Provider, MD  LORazepam (ATIVAN) 0.5 MG tablet Take 0.25 mg by mouth 2 (two) times daily.   Yes Historical Provider, MD  Nutritional Supplements (NUTRITIONAL DRINK PO) Take 1 Container by mouth 3 (three) times daily between meals. Great shakes   Yes Historical Provider, MD  Probiotic Product (PROBIOTIC PO) Take 1 capsule by mouth 2 (two) times daily. 08/27/15 09/10/15 Yes Historical Provider, MD  protein supplement (RESOURCE BENEPROTEIN) POWD Take 2 scoop by mouth 3 (three) times daily with meals. Mix into 4 oz juice   Yes Historical Provider, MD  QUEtiapine (SEROQUEL) 25 MG tablet Take 25 mg by mouth 2 (two) times daily.    Yes Historical Provider, MD  traMADol (ULTRAM) 50 MG tablet Take 50 mg by mouth every 12 (twelve) hours as needed for moderate pain.   Yes Historical Provider, MD  zinc gluconate 50 MG tablet Take 50 mg by mouth daily.  Yes Historical Provider, MD   Physical Exam: Filed Vitals:   08/29/15 1830 08/29/15 1835  BP:  60/40  Pulse: 95   Resp: 16   SpO2: 95%     Wt Readings from Last 3 Encounters:  03/16/12 62.869 kg (138 lb 9.6 oz)    General:  Non-responsive Eyes: normal lids, irises & conjunctiva ENT: clear and dry mucus membranes Neck: no LAD, masses Cardiovascular: RRR, No LE edema  Respiratory: CTA bilaterally, no w/r/r Abdomen: soft, ntnd Skin: no rash or induration seen on limited exam Musculoskeletal: unable to participate Psychiatric: unable to assess Neurologic: not able to assess.          Labs on Admission:   Basic Metabolic Panel: No results for input(s): NA, K, CL, CO2, GLUCOSE, BUN, CREATININE, CALCIUM, MG, PHOS in the last 168 hours. Liver Function Tests: No results for input(s): AST, ALT, ALKPHOS, BILITOT, PROT, ALBUMIN in the last 168 hours. No results for input(s): LIPASE, AMYLASE in the last 168 hours. No results for input(s): AMMONIA in the last 168 hours. CBC:  Recent Labs Lab 08/29/15 2052  WBC 18.2*  NEUTROABS 16.0*  HGB 13.2  HCT 41.5  MCV 94.3  PLT 261   Cardiac Enzymes: No results for input(s): CKTOTAL, CKMB, CKMBINDEX, TROPONINI in the last 168 hours.  BNP (last 3 results) No results for input(s): BNP in the last 8760 hours.  ProBNP (last 3 results) No results for input(s): PROBNP in the last 8760 hours.  CBG: No results for input(s): GLUCAP in the last 168 hours.  Radiological Exams on Admission: No results found.    Assessment/Plan Active Problems:   Sepsis ([place])   Hypotension   Alzheimer's dementia   1. Sepsis -as noted above daughter does not want heroic measures but is willing to try antibiotics -will start on zosyn and Vanc -will also start on IVF with a 2 liter bolus now -maintain on IVF at 75cc/hour  2. Hypotension -likely from sepsis -daughter does not want pressors -as noted will to try fluids presently -will hold all antihypertensives  3. Alzheimers dementia -will monitor  4. Hypertension -will hold all antihypertensives  5. Palliative care -will consult in am     Code Status: DNR Comfort care DVT Prophylaxis:Heparin Family Communication: daughter [name] by phone who I discussed the case with and does not want him to be treated aggressively but is willing to try antibiotics and IVF for now Disposition Plan: Palliative care    [facility] A [facility] Pager [phone]

## 2015-08-29 NOTE — ED Notes (Signed)
Per EMS: Pt from [place].  Pt has had lethargy x 1 wk.  Decreased appetite, also had diarrhea.  Upon EMS arrival, pt was slumped over in wheelchair, cold to touch, weak radial pulse.  Noncommunicative the entire time.  Unable to obtain accurate BP.

## 2015-08-30 DIAGNOSIS — A419 Sepsis, unspecified organism: Principal | ICD-10-CM

## 2015-08-30 LAB — CBC WITH DIFFERENTIAL/PLATELET
BASOS ABS: 0 10*3/uL (ref 0.0–0.1)
Basophils Relative: 0 %
EOS ABS: 0.1 10*3/uL (ref 0.0–0.7)
EOS PCT: 0 %
HCT: 42.4 % (ref 39.0–52.0)
Hemoglobin: 13.1 g/dL (ref 13.0–17.0)
LYMPHS ABS: 1.4 10*3/uL (ref 0.7–4.0)
Lymphocytes Relative: 9 %
MCH: 29.4 pg (ref 26.0–34.0)
MCHC: 30.9 g/dL (ref 30.0–36.0)
MCV: 95.3 fL (ref 78.0–100.0)
MONO ABS: 1.1 10*3/uL — AB (ref 0.1–1.0)
Monocytes Relative: 7 %
Neutro Abs: 13.4 10*3/uL — ABNORMAL HIGH (ref 1.7–7.7)
Neutrophils Relative %: 84 %
PLATELETS: 264 10*3/uL (ref 150–400)
RBC: 4.45 MIL/uL (ref 4.22–5.81)
RDW: 15 % (ref 11.5–15.5)
WBC: 15.9 10*3/uL — AB (ref 4.0–10.5)

## 2015-08-30 LAB — COMPREHENSIVE METABOLIC PANEL
ALT: 44 U/L (ref 17–63)
AST: 98 U/L — ABNORMAL HIGH (ref 15–41)
Albumin: 3.5 g/dL (ref 3.5–5.0)
Alkaline Phosphatase: 76 U/L (ref 38–126)
Anion gap: 11 (ref 5–15)
BUN: 123 mg/dL — ABNORMAL HIGH (ref 6–20)
CHLORIDE: 120 mmol/L — AB (ref 101–111)
CO2: 19 mmol/L — AB (ref 22–32)
CREATININE: 3.63 mg/dL — AB (ref 0.61–1.24)
Calcium: 8.7 mg/dL — ABNORMAL LOW (ref 8.9–10.3)
GFR calc non Af Amer: 14 mL/min — ABNORMAL LOW (ref >60)
GFR, EST AFRICAN AMERICAN: 16 mL/min — AB (ref >60)
Glucose, Bld: 130 mg/dL — ABNORMAL HIGH (ref 65–99)
POTASSIUM: 5.7 mmol/L — AB (ref 3.5–5.1)
SODIUM: 150 mmol/L — AB (ref 135–145)
Total Bilirubin: 0.4 mg/dL (ref 0.3–1.2)
Total Protein: 7.2 g/dL (ref 6.5–8.1)

## 2015-08-30 LAB — LACTIC ACID, PLASMA
LACTIC ACID, VENOUS: 1.1 mmol/L (ref 0.5–2.0)
Lactic Acid, Venous: 1.2 mmol/L (ref 0.5–2.0)

## 2015-08-30 LAB — APTT: APTT: 22 s — AB (ref 24–37)

## 2015-08-30 LAB — PROTIME-INR
INR: 1.23 (ref 0.00–1.49)
PROTHROMBIN TIME: 15.7 s — AB (ref 11.6–15.2)

## 2015-08-30 LAB — PROCALCITONIN: PROCALCITONIN: 0.59 ng/mL

## 2015-08-30 MED ORDER — RESOURCE INSTANT PROTEIN PO PWD PACKET
2.0000 | Freq: Three times a day (TID) | ORAL | Status: DC
  Filled 2015-08-30 (×10): qty 12

## 2015-08-30 MED ORDER — PIPERACILLIN-TAZOBACTAM IN DEX 2-0.25 GM/50ML IV SOLN
2.2500 g | Freq: Four times a day (QID) | INTRAVENOUS | Status: DC
  Administered 2015-08-30 – 2015-09-01 (×8): 2.25 g via INTRAVENOUS
  Filled 2015-08-30 (×13): qty 50

## 2015-08-30 MED ORDER — VANCOMYCIN HCL IN DEXTROSE 750-5 MG/150ML-% IV SOLN
750.0000 mg | INTRAVENOUS | Status: DC
  Administered 2015-08-31: 750 mg via INTRAVENOUS
  Filled 2015-08-30: qty 150

## 2015-08-30 MED ORDER — PIPERACILLIN-TAZOBACTAM 3.375 G IVPB: 3.3750 g | Freq: Three times a day (TID) | INTRAVENOUS | Status: DC

## 2015-08-30 NOTE — Progress Notes (Signed)
Nutrition Brief Note  Pt identified as having a low Braden score.  Chart reviewed. Pt now transitioning to comfort care per H&P.  No further nutrition interventions warranted at this time.  Please consult as needed.   Clayton Bibles, MS, RD, LDN Pager: [phone] After Hours Pager: [phone]

## 2015-08-30 NOTE — Progress Notes (Signed)
Palliative Medicine Team consult was received.   His daughter is available for a family meeting tomorrow at 3:30pm.   If there are urgent needs or questions please call [phone]. Thank you for consulting out team to assist with this patients care.  Micheline Rough, MD [place] Team [phone]

## 2015-08-30 NOTE — Progress Notes (Signed)
ANTIBIOTIC CONSULT NOTE - INITIAL  Pharmacy Consult for Zosyn/Vancomycin Indication: Sepsis  No Known Allergies  Patient Measurements: Weight: 125 lb 7.1 oz (56.9 kg)   Vital Signs: Temp: 98.5 F (36.9 C) (12/07 0000) Temp Source: Oral (12/07 0000) BP: 87/47 mmHg (12/07 0000) Pulse Rate: 54 (12/07 0000) Intake/Output from previous day:   Intake/Output from this shift:    Labs:  Recent Labs  08/29/15 2052  WBC 18.2*  HGB 13.2  PLT 261   CrCl cannot be calculated (Unknown ideal weight.). No results for input(s): VANCOTROUGH, VANCOPEAK, VANCORANDOM, GENTTROUGH, GENTPEAK, GENTRANDOM, TOBRATROUGH, TOBRAPEAK, TOBRARND, AMIKACINPEAK, AMIKACINTROU, AMIKACIN in the last 72 hours.   Microbiology: No results found for this or any previous visit (from the past 720 hour(s)).  Medical History: Past Medical History  Diagnosis Date  . Chronic kidney disease   . Cancer [facility])     prostate  . Frequent falls   . Alzheimer's dementia     Medications:  Prescriptions prior to admission  Medication Sig Dispense Refill Last Dose  . acetaminophen (TYLENOL) 500 MG tablet Take 500 mg by mouth 3 (three) times daily.    08/29/2015 at 0900  . cephALEXin (KEFLEX) 500 MG capsule Take 500 mg by mouth 2 (two) times daily.   08/29/2015 at 0800  . ergocalciferol (VITAMIN D2) 50000 UNITS capsule Take 50,000 Units by mouth every 30 (thirty) days.   08/24/2015 at 0900  . furosemide (LASIX) 20 MG tablet Take 20 mg by mouth daily.   2 08/29/2015 at 0900  . lisinopril (PRINIVIL,ZESTRIL) 5 MG tablet Take 5 mg by mouth daily.   08/29/2015 at 0900  . LORazepam (ATIVAN) 0.5 MG tablet Take 0.25 mg by mouth 2 (two) times daily.   08/29/2015 at 0900  . Nutritional Supplements (NUTRITIONAL DRINK PO) Take 1 Container by mouth 3 (three) times daily between meals. Great shakes   08/29/2015 at 0900  . Probiotic Product (PROBIOTIC PO) Take 1 capsule by mouth 2 (two) times daily.   08/29/2015 at 0900  . protein supplement  (RESOURCE BENEPROTEIN) POWD Take 2 scoop by mouth 3 (three) times daily with meals. Mix into 4 oz juice   08/29/2015 at 0900  . QUEtiapine (SEROQUEL) 25 MG tablet Take 25 mg by mouth 2 (two) times daily.    08/29/2015 at 0900  . traMADol (ULTRAM) 50 MG tablet Take 50 mg by mouth every 12 (twelve) hours as needed for moderate pain.   unknown  . zinc gluconate 50 MG tablet Take 50 mg by mouth daily.   08/29/2015 at 0900   Scheduled:  . acetaminophen  500 mg Oral TID  . docusate sodium  100 mg Oral BID  . heparin  5,000 Units Subcutaneous 3 times per day  . piperacillin-tazobactam (ZOSYN)  IV  3.375 g Intravenous Q8H  . protein supplement  2 scoop Oral TID WC  . sodium chloride  1,000 mL Intravenous Q1H  . zinc sulfate  220 mg Oral Daily   Infusions:  . sodium chloride 75 mL/hr at 08/30/15 0053   Assessment: 4 yoM admitted with altered mental status.  Zosyn/Vancomycin per Rx for Sepsis.   Goal of Therapy:  Vancomycin trough level 15-20 mcg/ml  Plan:   Zosyn 2.25 Gm IV q6h  Vancomycin 1 Gm x1 then 750 mg IV q48h next dose 12/8 2200  May need to check Random Vanc level if SCr does not improve to redose  F/u Scr/cultures/levels as needed.  Lawana Pai R 08/30/2015,2:00 AM

## 2015-08-30 NOTE — Progress Notes (Signed)
Mithcell Ozbirn [id number] DOB: Mar 08, 1928 DOA: 08/29/2015 PCP: Tommy Medal, MD  Brief narrative:  81 y/o ? Known Dementia multipel episodc falls and injuries CKD Admitted with lethargy, hypotension, ? Infection Palliative care consulted   Past medical history-As per Problem list Chart reviewed as below-   Consultants:  pallaitive  Procedures:    Antibiotics:  vanc  Zosyn   Subjective  Awake but not oriented Seems close to basleine No othe rc/o    Objective    Interim History:   Telemetry:    Objective: Filed Vitals:   08/29/15 2257 08/30/15 0000 08/30/15 0521 08/30/15 1121  BP: 70/56 87/47 83/61  90/49  Pulse: 86 54 87 81  Temp:  98.5 F (36.9 C) 97.9 F (36.6 C) 97.9 F (36.6 C)  TempSrc:  Oral Oral Oral  Resp: 17 16 16 13   Weight:  56.9 kg (125 lb 7.1 oz)    SpO2: 90% 83% 85% 100%   No intake or output data in the 24 hours ending 08/30/15 1247  Exam:  General: awake but disoriented Cardiovascular: s1 s 2no m/r/g Respiratory: clear no adde dsodun Abdomen:  Soft nt nd no rebound nor gaurd Skin no le edema Neuro confused  Data Reviewed: Basic Metabolic Panel:  Recent Labs Lab 08/29/15 0145  NA 150*  K 5.7*  CL 120*  CO2 19*  GLUCOSE 130*  BUN 123*  CREATININE 3.63*  CALCIUM 8.7*   Liver Function Tests:  Recent Labs Lab 08/29/15 0145  AST 98*  ALT 44  ALKPHOS 76  BILITOT 0.4  PROT 7.2  ALBUMIN 3.5   No results for input(s): LIPASE, AMYLASE in the last 168 hours. No results for input(s): AMMONIA in the last 168 hours. CBC:  Recent Labs Lab 08/29/15 0145 08/29/15 2052  WBC 15.9* 18.2*  NEUTROABS 13.4* 16.0*  HGB 13.1 13.2  HCT 42.4 41.5  MCV 95.3 94.3  PLT 264 261   Cardiac Enzymes: No results for input(s): CKTOTAL, CKMB, CKMBINDEX, TROPONINI in the last 168 hours. BNP: Invalid input(s): POCBNP CBG: No results for input(s): GLUCAP in the last 168 hours.  No results found for this or any previous  visit (from the past 240 hour(s)).   Studies:              All Imaging reviewed and is as per above notation   Scheduled Meds: . acetaminophen  500 mg Oral TID  . docusate sodium  100 mg Oral BID  . heparin  5,000 Units Subcutaneous 3 times per day  . piperacillin-tazobactam (ZOSYN)  IV  2.25 g Intravenous 4 times per day  . protein supplement  2 scoop Oral TID WC  . [START ON 08/31/2015] vancomycin  750 mg Intravenous Q48H  . zinc sulfate  220 mg Oral Daily   Continuous Infusions: . sodium chloride 75 mL/hr at 08/30/15 0509     Assessment/Plan:   1. Toxic metabolic 123456 to uremia, vol depletion and hypernatremia.  Discontinue NS.  Chang eot IV Dextrose in effort to bring down sodium. Follow electrolytes daily as intent of care seems more palliative 2. Lactic acidosis-2/2 to AKI.  Monitor trend although seems to be resolving.  Will get Pro-calcitonin in am and d/xc abx if no concerns for infection. 3. Hypotension-needs volume-giving dextrose.  Monitor 4. Palliative care aware of patient an dto see when possible-Social worker contacted for possible placement    Verneita Griffes, MD  [facility] Pager [phone] 08/30/2015, 12:47 PM    LOS: 1 day

## 2015-08-31 DIAGNOSIS — Z7189 Other specified counseling: Secondary | ICD-10-CM

## 2015-08-31 DIAGNOSIS — L8990 Pressure ulcer of unspecified site, unspecified stage: Secondary | ICD-10-CM | POA: Insufficient documentation

## 2015-08-31 DIAGNOSIS — Z515 Encounter for palliative care: Secondary | ICD-10-CM

## 2015-08-31 DIAGNOSIS — I959 Hypotension, unspecified: Secondary | ICD-10-CM

## 2015-08-31 DIAGNOSIS — N179 Acute kidney failure, unspecified: Secondary | ICD-10-CM

## 2015-08-31 LAB — CBC
HEMATOCRIT: 36.4 % — AB (ref 39.0–52.0)
HEMOGLOBIN: 11.2 g/dL — AB (ref 13.0–17.0)
MCH: 29 pg (ref 26.0–34.0)
MCHC: 30.8 g/dL (ref 30.0–36.0)
MCV: 94.3 fL (ref 78.0–100.0)
Platelets: 223 10*3/uL (ref 150–400)
RBC: 3.86 MIL/uL — AB (ref 4.22–5.81)
RDW: 15 % (ref 11.5–15.5)
WBC: 10.1 10*3/uL (ref 4.0–10.5)

## 2015-08-31 LAB — COMPREHENSIVE METABOLIC PANEL
ALK PHOS: 61 U/L (ref 38–126)
ALT: 42 U/L (ref 17–63)
AST: 83 U/L — AB (ref 15–41)
Albumin: 3.2 g/dL — ABNORMAL LOW (ref 3.5–5.0)
Anion gap: 9 (ref 5–15)
BUN: 97 mg/dL — AB (ref 6–20)
CHLORIDE: 128 mmol/L — AB (ref 101–111)
CO2: 20 mmol/L — AB (ref 22–32)
CREATININE: 2.5 mg/dL — AB (ref 0.61–1.24)
Calcium: 8.6 mg/dL — ABNORMAL LOW (ref 8.9–10.3)
GFR calc Af Amer: 25 mL/min — ABNORMAL LOW (ref >60)
GFR calc non Af Amer: 22 mL/min — ABNORMAL LOW (ref >60)
GLUCOSE: 95 mg/dL (ref 65–99)
Potassium: 4.6 mmol/L (ref 3.5–5.1)
SODIUM: 157 mmol/L — AB (ref 135–145)
Total Bilirubin: 1.1 mg/dL (ref 0.3–1.2)
Total Protein: 6.6 g/dL (ref 6.5–8.1)

## 2015-08-31 LAB — GLUCOSE, CAPILLARY: GLUCOSE-CAPILLARY: 75 mg/dL (ref 65–99)

## 2015-08-31 LAB — PROCALCITONIN: PROCALCITONIN: 0.36 ng/mL

## 2015-08-31 NOTE — Clinical Social Work Note (Signed)
Clinical Social Work Assessment  Patient Details  Name: Evan Maynard MRN: [id number] Date of Birth: Jul 02, 1928  Date of referral:  08/31/15               Reason for consult:  Discharge Planning                Permission sought to share information with:  Family Supports Permission granted to share information::  Yes, Verbal Permission Granted  Name::     Evan Maynard  Agency::     Relationship::  daughter  Contact Information:  [phone]  Housing/Transportation Living arrangements for the past 2 months:  [place] of Information:  Patient Patient Interpreter Needed:  None Criminal Activity/Legal Involvement Pertinent to Current Situation/Hospitalization:  No - Comment as needed Significant Relationships:  Adult Children, Spouse Lives with:  Facility Resident Do you feel safe going back to the place where you live?  Yes Need for family participation in patient care:  Yes (Comment)  Care giving concerns:  Pt admitted from [facility] ALF memory care unit. Pt daughter met with Palliative Medicine MD and pt daughter hopeful that pt can return to [facility] memory care unit with hospice to follow.   Social Worker assessment / plan:    CSW met with pt daughter at bedside following pt daughter meeting with palliative care. Pt daughter shared that she wishes for pt to return to [facility] AL[facility] memory care unit with hospice support as long as facility can meet pt needs. Pt daughter stated that she contacted [facility] and spoke with RN, Angie who plans to come to the hospital tomorrow to assess pt for return. Pt daughter feels that this plan would be the most appropriate for pt as pt is familiar with the facility and pt is not able to participate in rehab at this time.   CSW to await [facility] memory care unit RN to assess pt in that AM to determine if [facility] memory care unit can accept pt back with hospice services.   CSW to continue to follow to provide support and assist with pt disposition needs.  Employment status:  Retired Forensic scientist:  [employer] PT Recommendations:  [address] [place]  / Referral to community resources:  [place]  Patient/Family's Response to care:  Pt alert and oriented to person only. Pt daughter supportive and actively involved in pt care. Pt daughter coping appropriately and feels hospice services at memory care ALF will be best plan for pt quality of life.  Patient/Family's Understanding of and Emotional Response to Diagnosis, Current Treatment, and Prognosis:  Pt daughter understanding of pt diagnosis and prognosis. Pt daughter is Therapist, sports and realistic surrounding goals. Pt daughter wants pt to remain comfortable.   Emotional Assessment Appearance:  Appears stated age Attitude/Demeanor/Rapport:  Unable to Assess Affect (typically observed):  Unable to Assess Orientation:  Oriented to Self Alcohol / Substance use:  Not Applicable Psych involvement (Current and /or in the community):  No (Comment)  Discharge Needs  Concerns to be addressed:  Discharge Planning Concerns Readmission within the last 30 days:  No Current discharge risk:  None Barriers to Discharge:  Continued Medical Work up   Alison Murray A, LCSW 08/31/2015, 4:53 PM  [phone]

## 2015-08-31 NOTE — Consult Note (Signed)
Consultation Note Date: 08/31/2015   Patient Name: Evan Maynard  DOB: 11/08/1927  MRN: [id number]  Age / Sex: 81 y.o., male  PCP: Tommy Medal, MD Referring Physician: Nita Sells, MD  Reason for Consultation: Establishing goals of care and Hospice Evaluation  Clinical Assessment/Narrative: Evan Maynard is a 81 y.o. male with prior history of CKD, Alzheimers dementia, recurrent falls found at the memory care unit where he lives slumped over in his chair.  He was significantly hypotensive on presentation and his BP and mental status have improved after starting him on antibiotics and gentle hydration. Palliative was consulted for goals of care moving forward based on his acute worsening in conjunction with his chronic decline from dementia.  Contacts/Participants in Discussion: Primary Decision Maker: Evan Maynard Relationship to Patient daughter/HCPOA HCPOA: yes   SUMMARY OF RECOMMENDATIONS - Patient with advanced dementia who has very poor quality of life who is admitted with infection. He is at high risk for recurrent infections and his daughter and I had long discussion regarding care plan moving forward. She believes that he has reached a point where he is no longer gaining quality time from hospitalizations and wants to pursue a care plan to allow him to stay in his home (at ALF) with a focus on comfort. - Plan is for treatment of this current infection followed by disposition back to his prior assisted living facility if they can accommodate his needs with hospice support. No further hospitalizations moving forward. - We completed a MOST form: DO NOT RESUSCITATE, comfort measures, no antibiotics, no IVF, no feeding tube  - He will be evaluated by his assisted living facility tomorrow to see if they will he be able to meet his care needs with addition of hospice support.    Code Status/Advance Care  Planning: DNR    Code Status Orders        Start     Ordered   08/29/15 2344  Do not attempt resuscitation (DNR)   Continuous    Question Answer Comment  In the event of cardiac or respiratory ARREST Do not call a "code blue"   In the event of cardiac or respiratory ARREST Do not perform Intubation, CPR, defibrillation or ACLS   In the event of cardiac or respiratory ARREST Use medication by any route, position, wound care, and other measures to relive pain and suffering. May use oxygen, suction and manual treatment of airway obstruction as needed for comfort.      08/29/15 2344    Advance Directive Documentation        Most Recent Value   Type of Advance Directive  Healthcare Power of Attorney, Out of facility DNR (pink MOST or yellow form), Living will   Pre-existing out of facility DNR order (yellow form or pink MOST form)     "MOST" Form in Place?        Other Directives:MOST Form  Symptom Management:   Patient with advanced dementia. He denies complaints this time. Will continue pain medication on as-needed basis if symptoms develop.  Palliative Prophylaxis:   Bowel Regimen, Delirium Protocol and Frequent Pain Assessment  Additional Recommendations (Limitations, Scope, Preferences):  Avoid hospitalizations  Psycho-social/Spiritual:  Support System: Fair, very involved daughter, but the patient is very private person  Desire for further Chaplaincy support:no Additional Recommendations: Education on Hospice  Prognosis: 81 year old male with advanced dementia who is no longer able to sit upright or ambulating on his own. He has had significant decline in his nutrition, functional  status, and cognition over the past several weeks to months. He has also admitted currently for infection and plan moving forward is for no further hospitalizations and plan for comfort care with hospice support. Based upon this, expected prognosis if his disease were to follow natural  trajectory would be less than 6 months.  Discharge Planning: To his prior assisted living facility with hospice support as long as they are able to meet his care needs. Plan is for evaluation by assisted living facility representative tomorrow morning.   Chief Complaint/ Primary Diagnoses: Present on Admission:  **None**  I have reviewed the medical record, interviewed the patient and family, and examined the patient. The following aspects are pertinent.  Past Medical History  Diagnosis Date  . Chronic kidney disease   . Cancer [facility])     prostate  . Frequent falls   . Alzheimer's dementia    Social History   Social History  . Marital Status: Legally Separated    Spouse Name: N/A  . Number of Children: N/A  . Years of Education: N/A   Social History Main Topics  . Smoking status: Never Smoker   . Smokeless tobacco: None  . Alcohol Use: No  . Drug Use: No  . Sexual Activity: Not Asked   Other Topics Concern  . None   Social History Narrative   Family History  Problem Relation Age of Onset  . Family history unknown: Yes   Scheduled Meds: . acetaminophen  500 mg Oral TID  . docusate sodium  100 mg Oral BID  . heparin  5,000 Units Subcutaneous 3 times per day  . piperacillin-tazobactam (ZOSYN)  IV  2.25 g Intravenous 4 times per day  . protein supplement  2 scoop Oral TID WC  . vancomycin  750 mg Intravenous Q48H  . zinc sulfate  220 mg Oral Daily   Continuous Infusions:  PRN Meds:.acetaminophen **OR** acetaminophen, morphine injection, ondansetron **OR** ondansetron (ZOFRAN) IV Medications Prior to Admission:  Prior to Admission medications   Medication Sig Start Date End Date Taking? Authorizing Provider  acetaminophen (TYLENOL) 500 MG tablet Take 500 mg by mouth 3 (three) times daily.    Yes Historical Provider, MD  cephALEXin (KEFLEX) 500 MG capsule Take 500 mg by mouth 2 (two) times daily. 08/27/15 09/20/2015 Yes Historical Provider, MD  ergocalciferol (VITAMIN  D2) 50000 UNITS capsule Take 50,000 Units by mouth every 30 (thirty) days.   Yes Historical Provider, MD  furosemide (LASIX) 20 MG tablet Take 20 mg by mouth daily.  07/04/14  Yes Historical Provider, MD  lisinopril (PRINIVIL,ZESTRIL) 5 MG tablet Take 5 mg by mouth daily.   Yes Historical Provider, MD  LORazepam (ATIVAN) 0.5 MG tablet Take 0.25 mg by mouth 2 (two) times daily.   Yes Historical Provider, MD  Nutritional Supplements (NUTRITIONAL DRINK PO) Take 1 Container by mouth 3 (three) times daily between meals. Great shakes   Yes Historical Provider, MD  Probiotic Product (PROBIOTIC PO) Take 1 capsule by mouth 2 (two) times daily. 08/27/15 09/10/15 Yes Historical Provider, MD  protein supplement (RESOURCE BENEPROTEIN) POWD Take 2 scoop by mouth 3 (three) times daily with meals. Mix into 4 oz juice   Yes Historical Provider, MD  QUEtiapine (SEROQUEL) 25 MG tablet Take 25 mg by mouth 2 (two) times daily.    Yes Historical Provider, MD  traMADol (ULTRAM) 50 MG tablet Take 50 mg by mouth every 12 (twelve) hours as needed for moderate pain.   Yes Historical Provider, MD  zinc gluconate 50 MG tablet Take 50 mg by mouth daily.   Yes Historical Provider, MD   No Known Allergies  Review of Systems  Unable to perform ROS: Dementia    Physical Exam  Constitutional: No distress.  Elderly male, cachectic, lying in bed  Eyes: Right eye exhibits no discharge. Left eye exhibits no discharge. No scleral icterus.  Neck: No JVD present.  Cardiovascular: Normal rate and regular rhythm.   Murmur heard. Respiratory: Effort normal. No stridor. No respiratory distress. He has no wheezes.  GI: He exhibits no distension. There is no tenderness.  Musculoskeletal: He exhibits no edema.  Neurological: He is alert. No cranial nerve deficit.  A few mumbled words. Unable to follow conversations. Does not follow commands.  Skin: Skin is warm and dry. He is not diaphoretic. No erythema.    Vital Signs: BP 98/48  mmHg  Pulse 69  Temp(Src) 97.9 F (36.6 C) (Axillary)  Resp 16  Ht 5\' 5"  (1.651 m)  Wt 56.9 kg (125 lb 7.1 oz)  SpO2 98%  SpO2: SpO2: 98 % O2 Device:SpO2: 98 % O2 Flow Rate: .O2 Flow Rate (L/min): 3 L/min  IO: Intake/output summary:  Intake/Output Summary (Last 24 hours) at 08/31/15 1801 Last data filed at 08/31/15 1430  Gross per 24 hour  Intake      0 ml  Output    400 ml  Net   -400 ml    LBM: Last BM Date: 08/31/15 Baseline Weight: Weight: 56.9 kg (125 lb 7.1 oz) Most recent weight: Weight: 56.9 kg (125 lb 7.1 oz)      Palliative Assessment/Data:  Flowsheet Rows        Most Recent Value   Intake Tab    Referral Department  Hospitalist   Unit at Time of Referral  Oncology Unit   Palliative Care Primary Diagnosis  Neurology   Date Notified  08/29/15   Palliative Care Type  New Palliative care   Reason for referral  Clarify Goals of Care, End of Life Care Assistance, Counsel Regarding Hospice   Date of Admission  08/29/15   Date first seen by Palliative Care  08/31/15   # of days Palliative referral response time  2 Day(s)   # of days IP prior to Palliative referral  0   Clinical Assessment    Pain Max last 24 hours  Not able to report   Pain Min Last 24 hours  Not able to report   Psychosocial & Spiritual Assessment    Palliative Care Outcomes    Patient/Family meeting held?  Yes      Additional Data Reviewed:  CBC:    Component Value Date/Time   WBC 10.1 08/31/2015 0400   HGB 11.2* 08/31/2015 0400   HCT 36.4* 08/31/2015 0400   PLT 223 08/31/2015 0400   MCV 94.3 08/31/2015 0400   NEUTROABS 16.0* 08/29/2015 2052   LYMPHSABS 1.1 08/29/2015 2052   MONOABS 1.1* 08/29/2015 2052   EOSABS 0.0 08/29/2015 2052   BASOSABS 0.0 08/29/2015 2052   Comprehensive Metabolic Panel:    Component Value Date/Time   NA 157* 08/31/2015 0400   K 4.6 08/31/2015 0400   CL 128* 08/31/2015 0400   CO2 20* 08/31/2015 0400   BUN 97* 08/31/2015 0400   CREATININE 2.50*  08/31/2015 0400   GLUCOSE 95 08/31/2015 0400   CALCIUM 8.6* 08/31/2015 0400   AST 83* 08/31/2015 0400   ALT 42 08/31/2015 0400   ALKPHOS 61 08/31/2015 0400  BILITOT 1.1 08/31/2015 0400   PROT 6.6 08/31/2015 0400   ALBUMIN 3.2* 08/31/2015 0400     Time In: 1530 Time Out: 1645 Time Total: 75 Greater than 50%  of this time was spent counseling and coordinating care related to the above assessment and plan.  Signed by: Micheline Rough, MD  Micheline Rough, MD  08/31/2015, 6:01 PM  Please contact Palliative Medicine Team phone at [phone] for questions and concerns.

## 2015-08-31 NOTE — Progress Notes (Signed)
Patient couldn't tolerate PO meds. Bed alarm activated, set off x3. Patient with open coccyx wound. Skin care provided after every BM and PRN. BM x2 tonight; soft, formed. Mouth care provided.

## 2015-08-31 NOTE — NC FL2 (Signed)
[place] LEVEL OF CARE SCREENING TOOL     IDENTIFICATION  Patient Name: Evan Maynard Birthdate: [birth year] Sex: male Admission Date (Current Location): 08/29/2015  [place] and [place] Number: [facility] and Address:  [facility],  [place] [address] [place]      Provider Number: [id number]  Attending Physician Name and Address:  Nita Sells, MD  Relative Name and Phone Number:       Current Level of Care: Hospital Recommended Level of Care: [place], Memory Care Prior Approval Number:    Date Approved/Denied:   PASRR Number:    Discharge Plan: Other (Comment) (Assisted Living Memory Care Unit)    Current Diagnoses: Patient Active Problem List   Diagnosis Date Noted  . Pressure ulcer 08/31/2015  . Sepsis ([place]) 08/29/2015  . Hypotension 08/29/2015  . Alzheimer's dementia 08/29/2015    Orientation ACTIVITIES/SOCIAL BLADDER RESPIRATION    Self  Passive Incontinent O2 (As needed) (3L nasal cannula)  BEHAVIORAL SYMPTOMS/MOOD NEUROLOGICAL BOWEL NUTRITION STATUS     (NONE) Incontinent Diet (Diet Carb Modified)  PHYSICIAN VISITS COMMUNICATION OF NEEDS Height & Weight Skin    Verbally 5\' 5"  (165.1 cm) 125 lbs. PU Stage and Appropriate Care     PU Stage 3 Dressing:  (Foam Dressing PRN)    AMBULATORY STATUS RESPIRATION    Assist extensive (total assist) O2 (As needed) (3L nasal cannula)      Personal Care Assistance Level of Assistance  Bathing, Feeding, Dressing Bathing Assistance: Maximum assistance Feeding assistance: Limited assistance Dressing Assistance: Maximum assistance      Functional Limitations Info  Sight, Hearing, Speech Sight Info: Adequate Hearing Info: Adequate Speech Info: Adequate       SPECIAL CARE FACTORS FREQUENCY  PT (By licensed PT), OT (By licensed OT)     PT Frequency: 5 x a week OT Frequency: 5 x a week           Additional Factors Info  Code  Status, Allergies Code Status Info: DNR code status Allergies Info: No Known Allergies     Isolation Precautions Info: Enteric precautions (UV disinfection): r/o CDIFF     Current Medications (08/31/2015):  This is the current hospital active medication list Current Facility-Administered Medications  Medication Dose Route Frequency Provider Last Rate Last Dose  . acetaminophen (TYLENOL) tablet 650 mg  650 mg Oral Q6H PRN Allyne Gee, MD       Or  . acetaminophen (TYLENOL) suppository 650 mg  650 mg Rectal Q6H PRN Allyne Gee, MD      . acetaminophen (TYLENOL) tablet 500 mg  500 mg Oral TID Allyne Gee, MD   500 mg at 08/30/15 0052  . docusate sodium (COLACE) capsule 100 mg  100 mg Oral BID Allyne Gee, MD   100 mg at 08/30/15 0052  . heparin injection 5,000 Units  5,000 Units Subcutaneous 3 times per day Allyne Gee, MD   5,000 Units at 08/31/15 1347  . morphine 2 MG/ML injection 1 mg  1 mg Intravenous Q3H PRN Allyne Gee, MD      . ondansetron [facility]) tablet 4 mg  4 mg Oral Q6H PRN Allyne Gee, MD       Or  . ondansetron (ZOFRAN) injection 4 mg  4 mg Intravenous Q6H PRN Allyne Gee, MD      . piperacillin-tazobactam (ZOSYN) IVPB 2.25 g  2.25 g Intravenous 4 times per day Mechele Claude  Green, RPH   2.25 g at 08/31/15 1131  . protein supplement (RESOURCE BENEPROTEIN) powder packet 12 g  2 scoop Oral TID WC Allyne Gee, MD   12 g at 08/30/15 0800  . vancomycin (VANCOCIN) IVPB 750 mg/150 ml premix  750 mg Intravenous Q48H Dorrene German, [facility]      . zinc sulfate capsule 220 mg  220 mg Oral Daily Allyne Gee, MD   220 mg at 08/30/15 1201     Discharge Medications: Please see discharge summary for a list of discharge medications.  Relevant Imaging Results:  Relevant Lab Results:  Recent Labs    Additional Information SSN: [id number]. Pt to have Hospice Services follow at ALF.   KIDD, SUZANNA A, LCSW

## 2015-08-31 NOTE — Progress Notes (Signed)
PT Cancellation Note  Patient Details Name: Brylyn Mabery MRN: [id number] DOB: [birth year]   Cancelled Treatment:    Reason Eval/Treat Not Completed: Other (comment) (note that  patient is  comfort care, family meeting with Palliative care today. will await goals of care. )   Claretha Cooper 08/31/2015, 7:53 AM Tresa Endo PT [phone]

## 2015-08-31 NOTE — Evaluation (Signed)
Physical Therapy Evaluation Patient Details Name: Evan Maynard MRN: [id number] DOB: 07-28-1928 Today's Date: 08/31/2015   History of Present Illness  Evan Maynard is a 81 y.o. male with prior history of CKD ,Alzheimers dementia ,falling episodes who presented to the ED 12/6/16with altered mental status.  The patient was found at the memory care unit slumped over in his chair. Patient has been declining according to his daughter for a while now. He does have a history of kidney disease in the past. He also was significantly hypotensive here in the ED  Clinical Impression  Patient was alert, stated his name.  He required total assist for mobilizing to edge of the bed, sitting with retropulsion, then total assist back into bed. Uncertain of goals of care at this time. RN aware of total assist level. Will follow up after Palliative meeting to determine PT indication.    Follow Up Recommendations  (ALF will need to determine ability to care for patient  at total assist level currently vs SNF. Patient  will not be skilled PT if  full comfort care is plan.)    Equipment Recommendations  None recommended by PT    Recommendations for Other Services       Precautions / Restrictions Precautions Precautions: Fall Restrictions Weight Bearing Restrictions: No      Mobility  Bed Mobility Overal bed mobility: Needs Assistance;+2 for physical assistance;+ 2 for safety/equipment Bed Mobility: Supine to Sit;Sit to Supine     Supine to sit: Total assist;HOB elevated Sit to supine: Total assist   General bed mobility comments: use of the bed pad and slide sheet to  mobilize patient to siting  at the edge of the bed and back into bed. Patient does not assist.  Transfers                 General transfer comment: NT  Ambulation/Gait                Stairs            Wheelchair Mobility    Modified Rankin (Stroke Patients Only)       Balance Overall balance assessment:  History of Falls;Needs assistance Sitting-balance support: No upper extremity supported;Feet supported Sitting balance-Leahy Scale: Zero Sitting balance - Comments: significant  posterior lean, gradually gained some control and held self upright, albeit was  still  in a posterior lean. Postural control: Posterior lean                                   Pertinent Vitals/Pain Pain Assessment: Faces Faces Pain Scale: No hurt    Home Living Family/patient expects to be discharged to:: Assisted living                 Additional Comments: unknown functional level    Prior Function           Comments: unknown     Hand Dominance        Extremity/Trunk Assessment   Upper Extremity Assessment: RUE deficits/detail;LUE deficits/detail RUE Deficits / Details: moves arms volitionlaly     LUE Deficits / Details: same as R   Lower Extremity Assessment: RLE deficits/detail;LLE deficits/detail RLE Deficits / Details: holds legs rigid, knee flexed  to about 60 degrees while sitting edge of bed. Did  extend the knees a small amount while sitting LLE Deficits / Details: same as L  Cervical / Trunk Assessment:  Kyphotic  Communication   Communication: Expressive difficulties;Receptive difficulties  Cognition Arousal/Alertness: Awake/alert Behavior During Therapy: WFL for tasks assessed/performed Overall Cognitive Status: No family/caregiver present to determine baseline cognitive functioning Area of Impairment: Attention;Following commands;Awareness       Following Commands: Follows one step commands inconsistently       General Comments: patient did state his name when asked, other speech was garbled.    General Comments      Exercises        Assessment/Plan    PT Assessment  (if  full comfort care is plan, No PT  follow up is  recommended.. Currently requires total care.)  PT Diagnosis Altered mental status;Generalized weakness   PT Problem List  Decreased strength;Decreased range of motion;Decreased activity tolerance;Decreased balance;Decreased cognition;Decreased safety awareness  PT Treatment Interventions Functional mobility training;Therapeutic activities;Therapeutic exercise;Balance training;Patient/family education   PT Goals (Current goals can be found in the Care Plan section) Acute Rehab PT Goals Patient Stated Goal: unanle PT Goal Formulation: Patient unable to participate in goal setting Time For Goal Achievement: 09/14/15 (trial) Potential to Achieve Goals: Poor    Frequency Min 2X/week (trial- if comfort care, will sign off.)   Barriers to discharge        Co-evaluation               End of Session   Activity Tolerance: Patient tolerated treatment well Patient left: in bed;with call bell/phone within reach;with bed alarm set Nurse Communication: Mobility status;Need for lift equipment         Time: 1145-1200 PT Time Calculation (min) (ACUTE ONLY): 15 min   Charges:   PT Evaluation $Initial PT Evaluation Tier I: 1 Procedure     PT G CodesClaretha Cooper 08/31/2015, 12:39 PM Tresa Endo PT [phone]

## 2015-08-31 NOTE — Progress Notes (Signed)
CSW received referral that pt admitted from [facility] and may need SNF upon discharge.   CSW reviewed chart and noted that Palliative Medicine Goals of Care meeting scheduled for 3:30 pm today.   CSW visited pt room and pt daughter not present at this time.  CSW to follow up following PMT GOC at 3:30 pm to complete full psychosocial assessment.   Alison Murray, MSW, [place] Work [phone]

## 2015-08-31 NOTE — Clinical Documentation Improvement (Signed)
Internal Medicine  Can the diagnosis of CKD be further specified?   CKD Stage I - GFR greater than or equal to 90  CKD Stage II - GFR 60-89  CKD Stage III - GFR 30-59  CKD Stage IV - GFR 15-29  CKD Stage V - GFR < 15  ESRD (End Stage Renal Disease)  Other condition  Unable to clinically determine   Supporting Information: : (risk factors, signs and symptoms, diagnostics, treatment) Component     Latest Ref Rng 08/29/2015 08/31/2015  BUN     6 - 20 mg/dL 123 (H) 97 (H)  Creatinine     0.61 - 1.24 mg/dL  3.63 (H) 2.50 (H)   Component     Latest Ref Rng 08/29/2015 08/31/2015  EGFR (Non-African Amer.)     >60 mL/min 14 (L) 22 (L)    Please exercise your independent, professional judgment when responding. A specific answer is not anticipated or expected. Please update your documentation within the medical record to reflect your response to this query. Thank you  Thank You, [place] [phone]

## 2015-08-31 NOTE — Progress Notes (Signed)
Lachlann Inselman [id number] DOB: 11-07-1927 DOA: 08/29/2015 PCP: Tommy Medal, MD  Brief narrative:  81 y/o ? Known Dementia multipel episodc falls and injuries CKD Admitted with lethargy, hypotension, ? Infection Palliative care consulted   Past medical history-As per Problem list Chart reviewed as below-   Consultants:  pallaitive  Procedures:    Antibiotics:  vanc  Zosyn   Subjective  Awake but not oriented No family a tthe bedside No acute issues per RN     Objective    Interim History:   Telemetry:    Objective: Filed Vitals:   08/30/15 1121 08/30/15 2134 08/31/15 0455 08/31/15 0900  BP: 90/49 90/45 106/51   Pulse: 81 59 66   Temp: 97.9 F (36.6 C) 97.4 F (36.3 C) 97.6 F (36.4 C)   TempSrc: Oral Axillary Oral   Resp: 13 14 16    Height:    5\' 5"  (1.651 m)  Weight:      SpO2: 100% 96% 95%    No intake or output data in the 24 hours ending 08/31/15 1437  Exam:  General: awake but disoriented Cardiovascular: s1 s 2no m/r/g Respiratory:no added sound Abdomen:  Soft nt nd no rebound nor gaurd Skin no le edema  Data Reviewed: Basic Metabolic Panel:  Recent Labs Lab 08/29/15 0145 08/31/15 0400  NA 150* 157*  K 5.7* 4.6  CL 120* 128*  CO2 19* 20*  GLUCOSE 130* 95  BUN 123* 97*  CREATININE 3.63* 2.50*  CALCIUM 8.7* 8.6*   Liver Function Tests:  Recent Labs Lab 08/29/15 0145 08/31/15 0400  AST 98* 83*  ALT 44 42  ALKPHOS 76 61  BILITOT 0.4 1.1  PROT 7.2 6.6  ALBUMIN 3.5 3.2*   No results for input(s): LIPASE, AMYLASE in the last 168 hours. No results for input(s): AMMONIA in the last 168 hours. CBC:  Recent Labs Lab 08/29/15 0145 08/29/15 2052 08/31/15 0400  WBC 15.9* 18.2* 10.1  NEUTROABS 13.4* 16.0*  --   HGB 13.1 13.2 11.2*  HCT 42.4 41.5 36.4*  MCV 95.3 94.3 94.3  PLT 264 261 223   Cardiac Enzymes: No results for input(s): CKTOTAL, CKMB, CKMBINDEX, TROPONINI in the last 168 hours. BNP: Invalid  input(s): POCBNP CBG:  Recent Labs Lab 08/31/15 0743  GLUCAP 75    Recent Results (from the past 240 hour(s))  Culture, blood (x 2)     Status: None (Preliminary result)   Collection Time: 08/29/15  1:35 AM  Result Value Ref Range Status   Specimen Description BLOOD RIGHT ANTECUBITAL  Final   Special Requests BOTTLES DRAWN AEROBIC ONLY 5ML  Final   Culture   Final    NO GROWTH 1 DAY Performed at [facility]    Report Status PENDING  Incomplete  Culture, blood (x 2)     Status: None (Preliminary result)   Collection Time: 08/29/15  1:45 AM  Result Value Ref Range Status   Specimen Description BLOOD RIGHT FOREARM  Final   Special Requests IN PEDIATRIC BOTTLE 3ML  Final   Culture   Final    NO GROWTH 1 DAY Performed at [facility]    Report Status PENDING  Incomplete     Studies:              All Imaging reviewed and is as per above notation   Scheduled Meds: . acetaminophen  500 mg Oral TID  . docusate sodium  100 mg Oral BID  . heparin  5,000 Units  Subcutaneous 3 times per day  . piperacillin-tazobactam (ZOSYN)  IV  2.25 g Intravenous 4 times per day  . protein supplement  2 scoop Oral TID WC  . vancomycin  750 mg Intravenous Q48H  . zinc sulfate  220 mg Oral Daily   Continuous Infusions:     Assessment/Plan:   1. Toxic metabolic 123456 to uremia, vol depletion and hypernatremia.  Discontinue NS.  Sodium still elevated.  Await palliative discussion prior to decision re: daily labs 2. Lactic acidosis-2/2 to AKI.  Monitor trend although seems to be resolving.  cycle Pro-calcitonin and keep on Abx for now although suspicion is this is more in keeping with AKI and acidosis from renal causes.  aKI is slowly resolving 3. Hypotension-needs volume-giving dextrose.  Slowly resolving 4. Palliative care to see when possible-Social worker contacted for possible placement in the next 24-48 hrs.    Verneita Griffes, MD  [facility] Pager  [phone] 08/31/2015, 2:37 PM    LOS: 2 days

## 2015-08-31 NOTE — Clinical Documentation Improvement (Signed)
Internal Medicine  Based on the clinical findings below, please document any associated diagnoses/conditions the patient has or may have and if POA   Decubitus ulcers with stage, location, anf if POA documented  Other  Clinically Undetermined  Supporting Information: Nursing Progress note 08/31/15: "Patient with open coccyx wound. Skin care provided after every BM and PRN."  RD: low braden score  Please exercise your independent, professional judgment when responding. A specific answer is not anticipated or expected. Please update your documentation within the medical record to reflect your response to this query. Thank you  Thank You, [place] [phone]

## 2015-09-01 LAB — PROCALCITONIN: Procalcitonin: 0.2 ng/mL

## 2015-09-01 LAB — GLUCOSE, CAPILLARY: GLUCOSE-CAPILLARY: 77 mg/dL (ref 65–99)

## 2015-09-01 MED ORDER — MORPHINE SULFATE (CONCENTRATE) 10 MG /0.5 ML PO SOLN: 10.0000 mg | ORAL | Status: AC | PRN

## 2015-09-01 MED ORDER — LORAZEPAM 0.5 MG PO TABS: 0.2500 mg | ORAL_TABLET | Freq: Two times a day (BID) | ORAL | Status: AC

## 2015-09-01 NOTE — NC FL2 (Signed)
[place] LEVEL OF CARE SCREENING TOOL     IDENTIFICATION  Patient Name: Evan Maynard Birthdate: 1928-05-22 Sex: male Admission Date (Current Location): 08/29/2015  [place] and [place] Number: [facility] and Address:  [facility],  [place] [address], [place]      Provider Number: [id number]  Attending Physician Name and Address:  Nita Sells, MD  Relative Name and Phone Number:       Current Level of Care: Hospital Recommended Level of Care: [place], Memory Care Prior Approval Number:    Date Approved/Denied:   PASRR Number:    Discharge Plan: Other (Comment) (Assisted Living Memory Care Unit)    Current Diagnoses: Patient Active Problem List   Diagnosis Date Noted  . Pressure ulcer 08/31/2015  . Sepsis ([place]) 08/29/2015  . Hypotension 08/29/2015  . Alzheimer's dementia 08/29/2015    Orientation RESPIRATION BLADDER Height & Weight    Self  O2 (As needed) (3L nasal cannula) Incontinent 5\' 5"  (165.1 cm) 125 lbs.  BEHAVIORAL SYMPTOMS/MOOD NEUROLOGICAL BOWEL NUTRITION STATUS     (NONE) Incontinent Diet (comfort feeds)  AMBULATORY STATUS COMMUNICATION OF NEEDS Skin   Assist extensive (total assist) Verbally PU Stage and Appropriate Care     PU Stage 3 Dressing:  (Foam Dressing PRN)                 Personal Care Assistance Level of Assistance  Bathing, Feeding, Dressing Bathing Assistance: Maximum assistance Feeding assistance: Limited assistance Dressing Assistance: Maximum assistance     Functional Limitations Info  Sight, Hearing, Speech Sight Info: Adequate Hearing Info: Adequate Speech Info: Adequate    SPECIAL CARE FACTORS FREQUENCY        Contractures      Additional Factors Info  Code Status, Allergies Code Status Info: DNR code status Allergies Info: No Known Allergies          Current Medications (09/01/2015):  This is the current hospital active medication  list Current Facility-Administered Medications  Medication Dose Route Frequency Provider Last Rate Last Dose  . acetaminophen (TYLENOL) tablet 650 mg  650 mg Oral Q6H PRN Allyne Gee, MD       Or  . acetaminophen (TYLENOL) suppository 650 mg  650 mg Rectal Q6H PRN Allyne Gee, MD      . acetaminophen (TYLENOL) tablet 500 mg  500 mg Oral TID Allyne Gee, MD   500 mg at 08/30/15 0052  . docusate sodium (COLACE) capsule 100 mg  100 mg Oral BID Allyne Gee, MD   100 mg at 08/30/15 0052  . heparin injection 5,000 Units  5,000 Units Subcutaneous 3 times per day Allyne Gee, MD   5,000 Units at 09/01/15 0603  . morphine 2 MG/ML injection 1 mg  1 mg Intravenous Q3H PRN Allyne Gee, MD   1 mg at 09/01/15 [id number]  . ondansetron (ZOFRAN) tablet 4 mg  4 mg Oral Q6H PRN Allyne Gee, MD       Or  . ondansetron [facility]) injection 4 mg  4 mg Intravenous Q6H PRN Allyne Gee, MD      . piperacillin-tazobactam (ZOSYN) IVPB 2.25 g  2.25 g Intravenous 4 times per day Dorrene German, RPH   2.25 g at 09/01/15 0604  . protein supplement (RESOURCE BENEPROTEIN) powder packet 12 g  2 scoop Oral TID WC Allyne Gee, MD   12 g at 08/30/15 0800  . vancomycin (  VANCOCIN) IVPB 750 mg/150 ml premix  750 mg Intravenous Q48H Dorrene German, RPH   750 mg at 08/31/15 2221  . zinc sulfate capsule 220 mg  220 mg Oral Daily Allyne Gee, MD   220 mg at 08/30/15 1201     Discharge Medications: Please see discharge summary for a list of discharge medications.  Relevant Imaging Results:  Relevant Lab Results:   Additional Information SSN: [id number]. Pt to have [facility] to follow at [facility]F.   KIDD, SUZANNA A, LCSW

## 2015-09-01 NOTE — Progress Notes (Signed)
Transportation here to transfer Evan Maynard to [facility]. Family at bedside.

## 2015-09-01 NOTE — Progress Notes (Signed)
Pt for discharge back to [facility] with [facility] to follow.   CSW spoke with [facility] unit who assessed pt this morning and confirmed that facility could accept pt back today with hospice services to follow at ALF and pt could return this evening once hospital bed and O2 were delivered. CSW faxed pt discharge information to facility and confirmed facility received. [facility] memory care unit stated that facility is working with pt PCP to get order to hospice in order to hospice to begin seeing pt on Monday. [facility] memory care unit stated that facility discussed with pt daughter that facility had to follow protocol with pt if there was something to happen during the weekend since hospice had not yet admitted pt under hospice services and pt daughter had expressed understanding surrounding this. [facility] memory care unit plans to notify nursing unit once equipment arrives to the facility and RN to arrange ambulance transport once notified.   RN updated and discharge packet provided for [facility]. RN to call [facility] by calling [phone] option 1 for English; option 3 for non-emergency transport once RN is notified that equipment has arrived to the facility.  CSW updated pt daughter via telephone regarding plan for discharge back to [facility] memory care unit this evening once equipment arrives. Pt daughter expressed understanding.  No further social work needs identified at this time.  CSW signing off.   Alison Murray, MSW, [place] Work [phone]

## 2015-09-01 NOTE — Discharge Summary (Signed)
Physician Discharge Summary  Evan Maynard [id number] DOB: 09-Nov-1927 DOA: 08/29/2015  PCP: Tommy Medal, MD  Admit date: 08/29/2015 Discharge date: 09/01/2015  Time spent: 25 minutes  Recommendations for Outpatient Follow-up:  1. Full comfort trajectory delienated 2. Scripts-Ativan & Morphine scripts given  3. Oxygen and lasix for comfort  4. Will follow at ALF with [facility] [place] following   Discharge Diagnoses:  Active Problems:   Sepsis ([place])   Hypotension   Alzheimer's dementia   Pressure ulcer   Discharge Condition: gaurded  Diet recommendation:  comfort  Filed Weights   08/30/15 0000  Weight: 56.9 kg (125 lb 7.1 oz)    Hospital Course:  81 y/o ? Known Dementia multipel episodc falls and injuries CKD Admitted with lethargy, hypotension, ? Infection Noted lactic acidosis without evidence of infection Palliative care consulted  Noted to have AKI and severe hypernatremia that was not responsive to Rx  Goals of care delineated by Pallaitve care and full comfort trajectory pursued  Consultations:  Palliative care  Discharge Exam: Filed Vitals:   08/31/15 1430 08/31/15 2047  BP: 98/48 91/48  Pulse: 69 54  Temp: 97.9 F (36.6 C) 97.9 F (36.6 C)  Resp: 16 16   A little agitated this am No specific c/o but not coherent  General: eomi ncat Cardiovascular: s1 s 2no m/r/g Respiratory: clear  Discharge Instructions   Discharge Instructions    Diet - low sodium heart healthy    Complete by:  As directed      Increase activity slowly    Complete by:  As directed           Current Discharge Medication List    START taking these medications   Details  Morphine Sulfate (MORPHINE CONCENTRATE) 10 mg / 0.5 ml concentrated solution Place 0.5 mLs (10 mg total) under the tongue every 2 (two) hours as needed for severe pain. Qty: 30 mL, Refills: 0      CONTINUE these medications which have CHANGED   Details  LORazepam (ATIVAN) 0.5 MG tablet Take  0.5 tablets (0.25 mg total) by mouth 2 (two) times daily. Qty: 30 tablet, Refills: 0      CONTINUE these medications which have NOT CHANGED   Details  acetaminophen (TYLENOL) 500 MG tablet Take 500 mg by mouth 3 (three) times daily.     furosemide (LASIX) 20 MG tablet Take 20 mg by mouth daily.  Refills: 2    protein supplement (RESOURCE BENEPROTEIN) POWD Take 2 scoop by mouth 3 (three) times daily with meals. Mix into 4 oz juice    QUEtiapine (SEROQUEL) 25 MG tablet Take 25 mg by mouth 2 (two) times daily.       STOP taking these medications     cephALEXin (KEFLEX) 500 MG capsule      ergocalciferol (VITAMIN D2) 50000 UNITS capsule      lisinopril (PRINIVIL,ZESTRIL) 5 MG tablet      Nutritional Supplements (NUTRITIONAL DRINK PO)      Probiotic Product (PROBIOTIC PO)      traMADol (ULTRAM) 50 MG tablet      zinc gluconate 50 MG tablet        No Known Allergies    The results of significant diagnostics from this hospitalization (including imaging, microbiology, ancillary and laboratory) are listed below for reference.    Significant Diagnostic Studies: Dg Chest Port 1 View  08/30/2015  CLINICAL DATA:  Acute onset of confusion, diarrhea, weakness, chills and fever. Initial encounter. EXAM: PORTABLE CHEST 1 VIEW  COMPARISON:  Chest radiograph from 04/13/2015 FINDINGS: The lungs are well-aerated. Peribronchial thickening is noted. Increased interstitial markings could reflect mild interstitial edema. A likely 1.3 cm calcified granuloma is noted at the right midlung zone, unchanged from April. There is no evidence of pleural effusion or pneumothorax. The cardiomediastinal silhouette is within normal limits. No acute osseous abnormalities are seen. IMPRESSION: Peribronchial thickening noted. Increased interstitial markings could reflect mild interstitial edema. Electronically Signed   By: Garald Balding M.D.   On: 08/30/2015 01:24    Microbiology: Recent Results (from the past  240 hour(s))  Culture, blood (x 2)     Status: None (Preliminary result)   Collection Time: 08/29/15  1:35 AM  Result Value Ref Range Status   Specimen Description BLOOD RIGHT ANTECUBITAL  Final   Special Requests BOTTLES DRAWN AEROBIC ONLY 5ML  Final   Culture   Final    NO GROWTH 1 DAY Performed at [facility]    Report Status PENDING  Incomplete  Culture, blood (x 2)     Status: None (Preliminary result)   Collection Time: 08/29/15  1:45 AM  Result Value Ref Range Status   Specimen Description BLOOD RIGHT FOREARM  Final   Special Requests IN PEDIATRIC BOTTLE 3ML  Final   Culture   Final    NO GROWTH 1 DAY Performed at [facility]    Report Status PENDING  Incomplete     Labs: Basic Metabolic Panel:  Recent Labs Lab 08/29/15 0145 08/31/15 0400  NA 150* 157*  K 5.7* 4.6  CL 120* 128*  CO2 19* 20*  GLUCOSE 130* 95  BUN 123* 97*  CREATININE 3.63* 2.50*  CALCIUM 8.7* 8.6*   Liver Function Tests:  Recent Labs Lab 08/29/15 0145 08/31/15 0400  AST 98* 83*  ALT 44 42  ALKPHOS 76 61  BILITOT 0.4 1.1  PROT 7.2 6.6  ALBUMIN 3.5 3.2*   No results for input(s): LIPASE, AMYLASE in the last 168 hours. No results for input(s): AMMONIA in the last 168 hours. CBC:  Recent Labs Lab 08/29/15 0145 08/29/15 2052 08/31/15 0400  WBC 15.9* 18.2* 10.1  NEUTROABS 13.4* 16.0*  --   HGB 13.1 13.2 11.2*  HCT 42.4 41.5 36.4*  MCV 95.3 94.3 94.3  PLT 264 261 223   Cardiac Enzymes: No results for input(s): CKTOTAL, CKMB, CKMBINDEX, TROPONINI in the last 168 hours. BNP: BNP (last 3 results) No results for input(s): BNP in the last 8760 hours.  ProBNP (last 3 results) No results for input(s): PROBNP in the last 8760 hours.  CBG:  Recent Labs Lab 08/31/15 0743 09/01/15 0827  GLUCAP 75 77       Signed:  Nita Sells  [facility] 09/01/2015, 9:23 AM

## 2015-09-01 NOTE — Care Management Important Message (Signed)
Important Message  Patient Details  Name: Evan Maynard MRN: [id number] Date of Birth: December 07, 1927   Medicare Important Message Given:  Yes    Camillo Flaming 09/01/2015, 12:00 [place] Message  Patient Details  Name: Evan Maynard MRN: [id number] Date of Birth: 1928-02-02   Medicare Important Message Given:  Yes    Camillo Flaming 09/01/2015, 11:59 AM

## 2015-09-02 NOTE — Progress Notes (Signed)
To pharmacy - unable to waste full morphine syringe in Pyxis. Patient taken out before waste occurred. Second RN verification from [employer] of full waste 2mg  morphine IV into sink.

## 2015-09-04 LAB — CULTURE, BLOOD (ROUTINE X 2)
CULTURE: NO GROWTH
Culture: NO GROWTH

## 2015-09-24 DEATH — deceased
